# Patient Record
Sex: Female | Born: 1960
Health system: Southern US, Community
[De-identification: ages and names within clinical notes are randomized; demographics above are authoritative.]

## PROBLEM LIST (undated history)

## (undated) DIAGNOSIS — Z8719 Personal history of other diseases of the digestive system: Secondary | ICD-10-CM

## (undated) DIAGNOSIS — E669 Obesity, unspecified: Secondary | ICD-10-CM

## (undated) DIAGNOSIS — E119 Type 2 diabetes mellitus without complications: Secondary | ICD-10-CM

## (undated) DIAGNOSIS — J189 Pneumonia, unspecified organism: Secondary | ICD-10-CM

## (undated) DIAGNOSIS — F329 Major depressive disorder, single episode, unspecified: Secondary | ICD-10-CM

## (undated) DIAGNOSIS — M1990 Unspecified osteoarthritis, unspecified site: Secondary | ICD-10-CM

## (undated) DIAGNOSIS — K219 Gastro-esophageal reflux disease without esophagitis: Secondary | ICD-10-CM

## (undated) DIAGNOSIS — F32A Depression, unspecified: Secondary | ICD-10-CM

## (undated) DIAGNOSIS — J449 Chronic obstructive pulmonary disease, unspecified: Secondary | ICD-10-CM

## (undated) DIAGNOSIS — F419 Anxiety disorder, unspecified: Secondary | ICD-10-CM

## (undated) DIAGNOSIS — N390 Urinary tract infection, site not specified: Secondary | ICD-10-CM

## (undated) HISTORY — DX: Pneumonia, unspecified organism: J18.9

## (undated) HISTORY — DX: Anxiety disorder, unspecified: F41.9

## (undated) HISTORY — PX: TUBAL LIGATION: SHX77

## (undated) HISTORY — DX: Obesity, unspecified: E66.9

## (undated) HISTORY — DX: Urinary tract infection, site not specified: N39.0

## (undated) HISTORY — PX: CHOLECYSTECTOMY: SHX55

## (undated) HISTORY — DX: Depression, unspecified: F32.A

## (undated) HISTORY — PX: UPPER GI ENDOSCOPY: SHX6162

## (undated) HISTORY — PX: ABDOMINAL HYSTERECTOMY: SHX81

## (undated) HISTORY — PX: COLONOSCOPY: SHX174

## (undated) HISTORY — DX: Unspecified osteoarthritis, unspecified site: M19.90

---
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
## 2005-09-21 ENCOUNTER — Inpatient Hospital Stay (HOSPITAL_COMMUNITY): Admission: AD | Admit: 2005-09-21 | Discharge: 2005-09-21 | Payer: Self-pay | Admitting: *Deleted

## 2005-10-16 ENCOUNTER — Ambulatory Visit: Payer: Self-pay | Admitting: Obstetrics and Gynecology

## 2005-12-03 ENCOUNTER — Ambulatory Visit: Payer: Self-pay | Admitting: Obstetrics and Gynecology

## 2005-12-03 ENCOUNTER — Inpatient Hospital Stay (HOSPITAL_COMMUNITY): Admission: RE | Admit: 2005-12-03 | Discharge: 2005-12-05 | Payer: Self-pay | Admitting: Obstetrics and Gynecology

## 2005-12-03 ENCOUNTER — Encounter (INDEPENDENT_AMBULATORY_CARE_PROVIDER_SITE_OTHER): Payer: Self-pay | Admitting: *Deleted

## 2005-12-09 ENCOUNTER — Inpatient Hospital Stay (HOSPITAL_COMMUNITY): Admission: AD | Admit: 2005-12-09 | Discharge: 2005-12-09 | Payer: Self-pay | Admitting: Obstetrics and Gynecology

## 2006-01-01 ENCOUNTER — Ambulatory Visit: Payer: Self-pay | Admitting: Obstetrics and Gynecology

## 2008-02-18 ENCOUNTER — Ambulatory Visit: Payer: Self-pay | Admitting: Obstetrics and Gynecology

## 2011-03-27 NOTE — Group Therapy Note (Signed)
NAMEMarland Kitchen  [name], [name] NO.:  [id number]   MEDICAL RECORD NO.:  [id number]          PATIENT TYPE:  WOC   LOCATION:  [facility]                   FACILITY:  [facility]   PHYSICIAN:  Argentina Donovan, MD        DATE OF BIRTH:  1961-05-08   DATE OF SERVICE:  02/18/2008                                  CLINIC NOTE   The patient is a 50 year old African American female who we saw in 2007  and underwent a total abdominal hysterectomy.  She had been well up  until recently.  She was recently seen in the emergency room at [facility] complaining of right lower quadrant pain that did seem to change  to the left lower quadrant and shot down to the mid portion suprapubic  area.  Down there they did a CT of the abdomen and pelvis and found a  nonobstructing calculus on the left kidney.  Her kidneys other than that  were not remarkable.  Adrenal glands were normal.  The bowel was normal.  The aorta and superior vena cava were normal.  The patient's liver,  spleen, gallbladder, biliary tree and pancreas were all normal.  The  urinary bladder filled and was normal.  They thought there might be a  small follicular cyst on the right ovary but that was not quite clear.  The uterus obviously was absent.  There were no signs of any pelvic  abnormalities.  No inguinal or abdominal wall abnormalities.  It sounded  like the patient may be having bladder spasm.  Will get a urine culture  and analysis and also try her on some Pyridium and see if that is of any  help to her.   DIAGNOSIS:  Low pelvic pain of unknown etiology.           ______________________________  Argentina Donovan, MD     PR/MEDQ  D:  02/18/2008  T:  02/18/2008  Job:  [id number]

## 2011-03-30 NOTE — Op Note (Signed)
[name], [name]                ACCOUNT NO.:  [id number]   MEDICAL RECORD NO.:  [id number]          PATIENT TYPE:  INP   LOCATION:  9399                          [facility]   PHYSICIAN:  Phil D. Okey Dupre, M.D.     DATE OF BIRTH:  28-Feb-1961   DATE OF PROCEDURE:  12/03/2005  DATE OF DISCHARGE:                                 OPERATIVE REPORT   PROCEDURE:  Total abdominal hysterectomy.   PREOPERATIVE DIAGNOSIS:  Symptomatic fibroids with menometrorrhagia and  chronic pelvic pain and dysmenorrhea.   SURGEON:  Javier Glazier. Okey Dupre, M.D.   FIRST ASSISTANT:  Shelbie Proctor. Shawnie Pons, M.D.   ANESTHESIA:  General.   SPECIMENS TO PATHOLOGY:  Uterus.   ESTIMATED BLOOD LOSS:  100 mL.   POSTOPERATIVE CONDITION:  Satisfactory.   The procedure went as follows:   OPERATIVE FINDINGS:  On entering the peritoneal cavity, attention was  directed to the pelvis, where there was a uterus markedly deformed with  multiple intramural and subserous leiomyomata.  In total the uterus looked  like a 12-week size pregnancy.  The right ovary was completely normal, as  was the tube, with a small Falope ring on each tube.  The left ovary showed  a small follicular cyst.  The upper abdominal viscera was explored and found  to be within normal limits/   The procedure went as follows:  Under satisfactory general anesthesia with  the patient in the dorsal supine position with a Foley catheter in the  urinary bladder, the vagina had been prepped.  The abdomen was prepped and  draped in the usual sterile manner and entered through a Pfannenstiel  incision.  This incision was approximately 2 cm above the symphysis pubis  for a total length of 16 cm  On entering the peritoneal cavity, the lower  abdomen was opened in a typical Pfannenstiel method and on entering the  peritoneal cavity, the findings were as above.  A Balfour retractor was  placed in the abdomen and the uterus brought out through the incision.  Straight Kocher  clamps wee placed lateral to the uterus, immediately lateral  to the uterus to include the fallopian tubes down to the round ligament.  This also was used for traction.  The round ligaments were then ligated with  #1 chromic catgut suture ligature, divided, and the anterior leaf of broad  ligament opened parallel to the uterus and extending around to the anterior  superior portion of the cervix, opening made in the avascular portions of  the broad ligament, through which #1 chromic catgut suture was delivered,  and this was tied medial to the ovary on the utero-ovarian ligament and  fallopian tube.  A clamp was placed medial to the aforementioned, the tissue  medial to that divided on both sides and the lateral pedicle ligated with #1  chromic catgut suture ligature.  The bladder was pushed away from the  anterior surface of the cervix by blunt dissection until it was down to the  base of the cervix.  The uterine vessels were then skeletonized, doubly  clamped, divided,  doubly ligated #1 chromic catgut suture ligature.  Cardinal ligaments were clamped, divided and ligated with #1 chromic catgut  suture ligature.  At this point because of the size of the uterus, the  uterus was dissected away from the apex of the cervix by sharp dissection,  which left only the cervix in place.  The vagina was entered to the anterior  lower portion of the cervix and the cervix dissected with a Jorgenson  scissors away from the apex of the vagina.  Chromic #1 catgut sutures were  placed into the lateral vaginal cuff angles and the rest of the cuff run  from anterior to posterior with continuous running 0 Vicryl suture on an  atraumatic needle.  Care was used to make sure that coming through the  tissue that we were able to grasp the vaginal tissue,  There seemed to  oozing along the suture line, so second figure-of-eight sutures two in  number of the same material, were placed on the vaginal cuff for  hemostasis.  Te area was observed and no bleeding noted either the areas of the ovaries  or up to and including the vaginal cuff  The pelvis was irrigated, the upper  abdominal viscera explored as aforementioned, and the fascia closed with a  continuous running locked 0 Vicryl suture going from each end of the  incision and meeting in the midpoint.  The subcutaneous bleeders were  controlled with hot cautery and skin edges approximated with skin staples.  Dry sterile dressing was applied.  The patient had a total blood loss 700  mL.  Tape, instrument, sponge and needle count were reported correct at the  end of the procedure.  The patient was transferred to the recovery room in  satisfactory condition, having tolerated the procedure well.           ______________________________  Javier Glazier. Okey Dupre, M.D.     PDR/MEDQ  D:  12/03/2005  T:  12/03/2005  Job:  [id number]

## 2011-03-30 NOTE — Discharge Summary (Signed)
[name], [name]                ACCOUNT NO.:  [id number]   MEDICAL RECORD NO.:  [id number]          PATIENT TYPE:  INP   LOCATION:  9317                          [facility]   PHYSICIAN:  Phil D. Okey Dupre, M.D.     DATE OF BIRTH:  06-May-1961   DATE OF ADMISSION:  12/03/2005  DATE OF DISCHARGE:                                 DISCHARGE SUMMARY   The patient is a 50 year old black female who on the day of admission  underwent total abdominal hysterectomy for symptomatic leiomyomata. Her  postoperative course to date has been benign. The patient is voiding well  and had a bowel movement the a.m. of discharge. The pathology is not  available at this point. Her hemoglobin is stable at 11.2. The wound is  clean, the lungs were clear, the abdomen is soft and flat with active bowel  sounds, extremities negative. The patient is anxious for discharge. We have  given her discharge instructions as to diet, activity and follow-up. She  will come into the MAU next Sunday for staple removal and has an appointment  in the GYN clinic on December 18, 2005.   DISCHARGE DIAGNOSES:  1.  Status post abdominal hysterectomy, excellent progress.  2.  Symptomatic leiomyomata uteri.           ______________________________  Javier Glazier. Okey Dupre, M.D.     PDR/MEDQ  D:  12/05/2005  T:  12/05/2005  Job:  [id number]

## 2011-03-30 NOTE — H&P (Signed)
NAME:  [name], [name]                    ACCOUNT NO.:  0   MEDICAL RECORD NO.:  [id number]           PATIENT TYPE:   LOCATION:                                 FACILITY:   PHYSICIAN:  Phil D. Okey Dupre, M.D.     DATE OF BIRTH:  1961/09/12   DATE OF ADMISSION:  12/03/2005  DATE OF DISCHARGE:                                HISTORY & PHYSICAL   HISTORY OF PRESENT ILLNESS:  The patient is a 50 year old, African-American  female, G4, P3-0-1-3 who had a history of a tubal ligation.  She has had a  long history of severe pelvic pain during her period and increasing  menorrhagia with passage of clots and secondary anemia.  She also complains  that radiates down causing leg spasm in her lower back.  I told her that  taking the uterus out probably is not going to relieve those problems, but  she seemed to desire to go through this because the periods are so  uncomfortable for her.  Recent urinary tract infection was treated.   PAST MEDICAL HISTORY:  1.  Normal vaginal deliveries.  2.  Tubal ligation.  3.  Recent treatment for cystitis.   ALLERGIES:  PENICILLIN.   MEDICATIONS:  Multivitamins.   FAMILY HISTORY:  Diabetes, high blood pressure.  All her brothers and  sisters are alive and well as is her mother and father.   SOCIAL HISTORY:  She smokes three cigarettes a day.  She smokes marijuana,  but no takes no other illegal drugs.  She does not drink alcohol.   PHYSICAL EXAMINATION:  VITAL SIGNS:  Weight 167, height 5 feet 1 inch.  Blood pressure 117/70, pulse 68 per minute, temperature 98.7, respirations  18 per minute.  GENERAL:  Well-developed, slightly obese, African-American female in no  acute distress.  HEENT:  Within normal limits.  NECK:  Supple with no rigidity, no masses, thyroid symmetrical.  HEART:  No murmur with normal sinus rhythm.  LUNGS:  Clear to auscultation and percussion.  ABDOMEN:  Soft, flat, nontender, no masses or organomegaly.  EXTREMITIES:  No edema, no  varices.  NEUROLOGIC:  Deep tendon reflexes within normal limits.  SKIN:  Normal turgor.  No significant lesions.  GENITALIA:  External genitalia normal.  BUS within normal limits.  Vagina  clean and well-rugated.  Uterus is markedly irregular at about 12-week  gestational size.  The adnexa could not be well-palpated because of the  habitus of the patient.  The cul-de-sac was free.  The bladder was quite  tender to palpation.  RECTAL:  No masses identified.  Hemoccult negative.   IMPRESSION:  Symptomatic leiomyomata uteri with menorrhagia and increasing  dysmenorrhea.   PLAN:  Total abdominal hysterectomy.  We have talked to the patient about  her ovaries and that unless they are significantly diseased, we plan on  leaving her ovaries.  We discussed with her and answered her questions on  sexual activity and activity after the surgery.  We have discussed a plan  immediately post surgical concerning pain medication, pain control, diet  as  well as Foley catheter.  We have discussed the major complications  especially those related to infection, hemorrhage, urinary and  gastrointestinal tract injury and anesthetic complications.  We have give  her the precautions postoperatively to be needed.           ______________________________  Javier Glazier Okey Dupre, M.D.     PDR/MEDQ  D:  11/29/2005  T:  11/29/2005  Job:  [id number]

## 2011-03-30 NOTE — Group Therapy Note (Signed)
NAME:  [name], [name] NO.:  [id number]   MEDICAL RECORD NO.:  [id number]          PATIENT TYPE:  WOC   LOCATION:  [facility]                   FACILITY:  [facility]   PHYSICIAN:  Argentina Donovan, MD        DATE OF BIRTH:  1961-06-27   DATE OF SERVICE:                                    CLINIC NOTE   The patient is a 50 year old gravida 4, para 3-0-1-3 (youngest child 57  years old), who, over the past year, has had severe pelvic pain with  increasing menorrhagia with passage of clots.  She was recently seen in the  maternity admissions with suprapubic pain and a severe urinary tract  infection, i.e., cystitis, that was treated with Macrobid.  She still feels  that she has infection.  We are going to get a urine culture and  sensitivity, as well as a urinalysis today.  Patient has had a Pap smear  normal within the past year.   PHYSICAL EXAMINATION:  PELVIC:  External genitalia was normal.  BUS within  normal limits.  The vagina is clean and well rugated.  The cervix is clean  and parous.  The uterus is about 11-12 weeks' size and markedly irregular in  configuration.  Cul de sac is free.  Bladder is quite tender to palpation.   She also complains of sharp lower abdominal pains that radiate around leg  spasms to her lower back.  I have told her that taking out the uterus  probably may not relieve those problems.  She seems to understand this and  desires to go through with a total abdominal hysterectomy.  We are going to  go ahead and get her scheduled.  Meanwhile, we will treat the urinary tract  infection when the results come back if necessary.  The patient has had  previous surgery.  It was a tubal ligation.  She smokes marijuana, but takes  no other illicit drugs.  She smokes three cigarettes a day, down from being  a heavy smoker.   FAMILY HISTORY:  Noncontributory.   PAST MEDICAL HISTORY:  She has had a relatively negative past medical  history.  She is in school  now to become a Engineer, site, but wants the  surgery, so she is willing to take off whatever time is necessary.   IMPRESSION:  Symptomatic leiomyomata for a total abdominal hysterectomy.           ______________________________  Argentina Donovan, MD     PR/MEDQ  D:  10/16/2005  T:  10/16/2005  Job:  [id number]

## 2011-08-07 LAB — POCT URINALYSIS DIP (DEVICE)
Bilirubin Urine: NEGATIVE
Glucose, UA: NEGATIVE
Hgb urine dipstick: NEGATIVE
Ketones, ur: NEGATIVE
Nitrite: NEGATIVE
Operator id: 194561
Protein, ur: NEGATIVE
Specific Gravity, Urine: 1.01
Urobilinogen, UA: 0.2
pH: 6

## 2013-07-12 ENCOUNTER — Emergency Department (HOSPITAL_BASED_OUTPATIENT_CLINIC_OR_DEPARTMENT_OTHER): Payer: Self-pay

## 2013-07-12 ENCOUNTER — Encounter (HOSPITAL_BASED_OUTPATIENT_CLINIC_OR_DEPARTMENT_OTHER): Payer: Self-pay | Admitting: *Deleted

## 2013-07-12 ENCOUNTER — Emergency Department (HOSPITAL_BASED_OUTPATIENT_CLINIC_OR_DEPARTMENT_OTHER)
Admission: EM | Admit: 2013-07-12 | Discharge: 2013-07-12 | Disposition: A | Discharge status: Home / Self Care | Payer: Self-pay | Attending: Emergency Medicine | Admitting: Emergency Medicine

## 2013-07-12 DIAGNOSIS — Z7982 Long term (current) use of aspirin: Secondary | ICD-10-CM | POA: Insufficient documentation

## 2013-07-12 DIAGNOSIS — H9209 Otalgia, unspecified ear: Secondary | ICD-10-CM | POA: Insufficient documentation

## 2013-07-12 DIAGNOSIS — F17200 Nicotine dependence, unspecified, uncomplicated: Secondary | ICD-10-CM | POA: Insufficient documentation

## 2013-07-12 DIAGNOSIS — R0982 Postnasal drip: Secondary | ICD-10-CM | POA: Insufficient documentation

## 2013-07-12 DIAGNOSIS — E119 Type 2 diabetes mellitus without complications: Secondary | ICD-10-CM | POA: Insufficient documentation

## 2013-07-12 DIAGNOSIS — R062 Wheezing: Secondary | ICD-10-CM | POA: Insufficient documentation

## 2013-07-12 DIAGNOSIS — J40 Bronchitis, not specified as acute or chronic: Secondary | ICD-10-CM

## 2013-07-12 DIAGNOSIS — Z880 Allergy status to penicillin: Secondary | ICD-10-CM | POA: Insufficient documentation

## 2013-07-12 DIAGNOSIS — Z79899 Other long term (current) drug therapy: Secondary | ICD-10-CM | POA: Insufficient documentation

## 2013-07-12 DIAGNOSIS — J3489 Other specified disorders of nose and nasal sinuses: Secondary | ICD-10-CM | POA: Insufficient documentation

## 2013-07-12 DIAGNOSIS — J209 Acute bronchitis, unspecified: Secondary | ICD-10-CM | POA: Insufficient documentation

## 2013-07-12 HISTORY — DX: Type 2 diabetes mellitus without complications: E11.9

## 2013-07-12 LAB — CBC WITH DIFFERENTIAL/PLATELET
Basophils Relative: 0 % (ref 0–1)
Eosinophils Absolute: 0.2 10*3/uL (ref 0.0–0.7)
HCT: 35 % — ABNORMAL LOW (ref 36.0–46.0)
Hemoglobin: 11.9 g/dL — ABNORMAL LOW (ref 12.0–15.0)
Lymphocytes Relative: 42 % (ref 12–46)
Lymphs Abs: 3 10*3/uL (ref 0.7–4.0)
MCH: 25.7 pg — ABNORMAL LOW (ref 26.0–34.0)
MCHC: 34 g/dL (ref 30.0–36.0)
MCV: 75.6 fL — ABNORMAL LOW (ref 78.0–100.0)
Neutro Abs: 3.5 10*3/uL (ref 1.7–7.7)
Neutrophils Relative %: 49 % (ref 43–77)
Platelets: 129 10*3/uL — ABNORMAL LOW (ref 150–400)
RBC: 4.63 MIL/uL (ref 3.87–5.11)
RDW: 14.7 % (ref 11.5–15.5)
WBC: 7.2 10*3/uL (ref 4.0–10.5)

## 2013-07-12 LAB — BASIC METABOLIC PANEL
BUN: 12 mg/dL (ref 6–23)
CO2: 24 mEq/L (ref 19–32)
Calcium: 9.3 mg/dL (ref 8.4–10.5)
Chloride: 105 mEq/L (ref 96–112)
Creatinine, Ser: 0.7 mg/dL (ref 0.50–1.10)
GFR calc Af Amer: >90 mL/min (ref >90)
GFR calc non Af Amer: >90 mL/min (ref >90)
Glucose, Bld: 127 mg/dL — ABNORMAL HIGH (ref 70–99)
Potassium: 3.2 mEq/L — ABNORMAL LOW (ref 3.5–5.1)
Sodium: 139 mEq/L (ref 135–145)

## 2013-07-12 LAB — TROPONIN I: Troponin I: <0.3 ng/mL (ref <0.30)

## 2013-07-12 MED ORDER — ALBUTEROL SULFATE (5 MG/ML) 0.5% IN NEBU
5.0000 mg | INHALATION_SOLUTION | Freq: Once | RESPIRATORY_TRACT | Status: AC
  Administered 2013-07-12: 5 mg via RESPIRATORY_TRACT

## 2013-07-12 MED ORDER — LORATADINE 10 MG PO TABS: 10.0000 mg | ORAL_TABLET | Freq: Every day | ORAL | Status: DC

## 2013-07-12 MED ORDER — ALBUTEROL SULFATE HFA 108 (90 BASE) MCG/ACT IN AERS: 1.0000 | INHALATION_SPRAY | Freq: Four times a day (QID) | RESPIRATORY_TRACT | Status: DC | PRN

## 2013-07-12 MED ORDER — AZITHROMYCIN 250 MG PO TABS: ORAL_TABLET | ORAL | Status: DC

## 2013-07-12 MED ORDER — FLUTICASONE PROPIONATE 50 MCG/ACT NA SUSP: 2.0000 | Freq: Every day | NASAL | Status: DC

## 2013-07-12 MED ORDER — ALBUTEROL SULFATE (5 MG/ML) 0.5% IN NEBU
INHALATION_SOLUTION | RESPIRATORY_TRACT | Status: AC
  Administered 2013-07-12: 5 mg via RESPIRATORY_TRACT
  Filled 2013-07-12: qty 1

## 2013-07-12 MED ORDER — POTASSIUM CHLORIDE CRYS ER 20 MEQ PO TBCR
40.0000 meq | EXTENDED_RELEASE_TABLET | Freq: Once | ORAL | Status: AC
  Administered 2013-07-12: 40 meq via ORAL
  Filled 2013-07-12: qty 2

## 2013-07-12 MED ORDER — IPRATROPIUM BROMIDE 0.02 % IN SOLN
RESPIRATORY_TRACT | Status: AC
  Administered 2013-07-12: 0.5 mg
  Filled 2013-07-12: qty 2.5

## 2013-07-12 MED ORDER — PREDNISONE 20 MG PO TABS: ORAL_TABLET | ORAL | Status: DC

## 2013-07-12 MED ORDER — PREDNISONE 10 MG PO TABS
60.0000 mg | ORAL_TABLET | Freq: Once | ORAL | Status: AC
  Administered 2013-07-12: 60 mg via ORAL
  Filled 2013-07-12: qty 1

## 2013-07-12 NOTE — ED Provider Notes (Signed)
CSN: [id number]     Arrival date & time 07/12/13  0023 History  This chart was scribed for Savoy Somerville Smitty Cords, MD by Karle Plumber, ED Scribe. This patient was seen in room MH05/MH05 and the patient's care was started at 12:40 AM.    Chief Complaint  Patient presents with  . Cough   Patient is a 52 y.o. female presenting with cough. The history is provided by the patient. No language interpreter was used.  Cough Cough characteristics:  Productive Sputum characteristics:  Green Severity:  Moderate Onset quality:  Gradual Duration:  3 days Timing:  Constant Progression:  Unchanged Chronicity:  New Smoker: yes   Context: smoke exposure   Relieved by:  Nothing Worsened by:  Nothing tried Ineffective treatments:  None tried Associated symptoms: ear pain and wheezing   Associated symptoms: no chest pain, no chills, no diaphoresis, no fever, no headaches, no rash and no weight loss    HPI Comments:  REECE MCBROOM is a 52 y.o. female with history of DM without complication who presents to the Emergency Department complaining of worsening productive cough for the past three days. She states that she produces a greenish sputum. She is also complaining of constant wheezing onset 3 days ago. There is associated congestion and right ear pain. Patient denies chest pain, fever, chills, sore throat, back pain, visual disturbance, abdominal pain, nausea, vomiting, headaches, rash, confusion or history of bleeding easily. She is a current smoker.   Past Medical History  Diagnosis Date  . Diabetes mellitus without complication    Past Surgical History  Procedure Laterality Date  . Abdominal hysterectomy     No family history on file. History  Substance Use Topics  . Smoking status: Current Some Day Smoker  . Smokeless tobacco: Not on file  . Alcohol Use: Yes     Comment: occasional    OB History   Grav Para Term Preterm Abortions TAB SAB Ect Mult Living                 Review of  Systems  Constitutional: Negative for fever, chills, weight loss and diaphoresis.  HENT: Positive for ear pain and congestion.   Eyes: Negative for visual disturbance.  Respiratory: Positive for cough and wheezing.   Cardiovascular: Negative for chest pain.  Gastrointestinal: Negative for vomiting and abdominal pain.  Genitourinary: Negative for difficulty urinating.  Musculoskeletal: Negative for back pain.  Skin: Negative for rash.  Neurological: Negative for headaches.  Hematological: Does not bruise/bleed easily.  Psychiatric/Behavioral: Negative for confusion.  All other systems reviewed and are negative.    Allergies  Penicillins and Sulfa antibiotics  Home Medications   Current Outpatient Rx  Name  Route  Sig  Dispense  Refill  . aspirin 81 MG tablet   Oral   Take 81 mg by mouth daily.         . metFORMIN (GLUCOPHAGE) 500 MG tablet   Oral   Take 500 mg by mouth daily. 250mg  AM and 250mg  PM          Triage Vitals: BP 124/85  Temp(Src) 97.8 F (36.6 C)  Resp 24  Ht 5\' 2"  (1.575 m)  Wt 202 lb (91.627 kg)  BMI 36.94 kg/m2  SpO2 100% Physical Exam  Vitals reviewed. Constitutional: She is oriented to person, place, and time. She appears well-developed and well-nourished.  HENT:  Head: Normocephalic and atraumatic.  Right Ear: Tympanic membrane, external ear and ear canal normal. No mastoid tenderness.  Tympanic membrane is not injected. No hemotympanum.  Left Ear: Tympanic membrane, external ear and ear canal normal. No mastoid tenderness. Tympanic membrane is not injected. No hemotympanum.  Clear, colorless drainage down the back of the throat with cobblestoning.  No abnormality behind right ear. No evidence of infection in right ear.  Eyes: Conjunctivae are normal. Pupils are equal, round, and reactive to light.  Neck: Normal range of motion. Neck supple.  Cardiovascular: Normal rate, regular rhythm, normal heart sounds and intact distal pulses.   No murmur  heard. Pulmonary/Chest: Effort normal. She has wheezes. She has no rales.  Abdominal: Soft. Bowel sounds are normal. There is no tenderness. There is no rebound and no guarding.  Musculoskeletal: Normal range of motion.  Lymphadenopathy:       Head (right side): No preauricular and no posterior auricular adenopathy present.       Head (left side): No preauricular and no posterior auricular adenopathy present.    She has no cervical adenopathy.  Neurological: She is alert and oriented to person, place, and time. She has normal reflexes.  Skin: Skin is warm and dry.  Psychiatric: She has a normal mood and affect.    ED Course  Procedures (including critical care time) DIAGNOSTIC STUDIES: Oxygen Saturation is 100% on RA, normal by my interpretation.   COORDINATION OF CARE: 12:45 AM- Pt verbalizes understanding and agrees to plan.   Labs Review Labs Reviewed  CBC WITH DIFFERENTIAL - Abnormal; Notable for the following:    Hemoglobin 11.9 (*)    HCT 35.0 (*)    MCV 75.6 (*)    MCH 25.7 (*)    Platelets 129 (*)    All other components within normal limits  TROPONIN I  BASIC METABOLIC PANEL    Imaging Review Dg Chest 2 View  07/12/2013   *RADIOLOGY REPORT*  Clinical Data: Cough and congestion.  CHEST - 2 VIEW  Comparison: None currently available.  Findings: No acute osseous findings.  Normal heart size and mediastinal contours.  Central airway thickening. Faint, indistinct left suprahilar opacity, likely vascular or summation of shadows.  Eventration of the diaphragm.  IMPRESSION:  1.  Bronchitic changes suggesting airway inflammation or infection. 2.  Vague left suprahilar density which could be projectional, inflammatory, or a nodule. Recommend repeat study after treatment.   Original Report Authenticated By: Tiburcio Pea    MDM  No diagnosis found. Will treat for bronchitis, as smoker and studies indicate better outcomes with antibiotics will add zpak.  Symptoms > 8 hours  with negative ekg and troponin ACS is excluded.  Patient informed of lesion seen on chest xray and need for close follow up with family doctor for repeat chest xray.  Information on discharge papers as well.  Patient verbalizes understanding and agrees to follow up  Date: 07/12/2013  Rate: 80  Rhythm: normal sinus rhythm  QRS Axis: normal  Intervals: normal  ST/T Wave abnormalities: nonspecific ST changes  Conduction Disutrbances:none  Narrative Interpretation:   Old EKG Reviewed: none available  I personally performed the services described in this documentation, which was scribed in my presence. The recorded information has been reviewed and is accurate.       Jasmine Awe, MD 07/12/13 [phone]

## 2013-07-12 NOTE — ED Notes (Signed)
MD at bedside. 

## 2013-07-12 NOTE — ED Notes (Signed)
C/o cough for past 3 days that is worse at night. States she is a smoker. States fevers on and off. States productive cough light in color. States chest "burning" when she is coughing. States she feels a little sob at night when trying to sleep.

## 2013-08-16 ENCOUNTER — Other Ambulatory Visit (HOSPITAL_BASED_OUTPATIENT_CLINIC_OR_DEPARTMENT_OTHER): Payer: Self-pay | Admitting: Internal Medicine

## 2013-08-16 ENCOUNTER — Ambulatory Visit (HOSPITAL_BASED_OUTPATIENT_CLINIC_OR_DEPARTMENT_OTHER)
Admission: RE | Admit: 2013-08-16 | Discharge: 2013-08-16 | Disposition: A | Discharge status: Home / Self Care | Payer: Self-pay | Source: Ambulatory Visit | Attending: Internal Medicine | Admitting: Internal Medicine

## 2013-08-16 DIAGNOSIS — Z09 Encounter for follow-up examination after completed treatment for conditions other than malignant neoplasm: Secondary | ICD-10-CM

## 2013-08-16 DIAGNOSIS — R079 Chest pain, unspecified: Secondary | ICD-10-CM | POA: Insufficient documentation

## 2014-07-21 ENCOUNTER — Emergency Department (HOSPITAL_BASED_OUTPATIENT_CLINIC_OR_DEPARTMENT_OTHER): Payer: Self-pay

## 2014-07-21 ENCOUNTER — Encounter (HOSPITAL_BASED_OUTPATIENT_CLINIC_OR_DEPARTMENT_OTHER): Payer: Self-pay | Admitting: Emergency Medicine

## 2014-07-21 ENCOUNTER — Emergency Department (HOSPITAL_BASED_OUTPATIENT_CLINIC_OR_DEPARTMENT_OTHER)
Admission: EM | Admit: 2014-07-21 | Discharge: 2014-07-22 | Disposition: A | Discharge status: Home / Self Care | Payer: Self-pay | Attending: Emergency Medicine | Admitting: Emergency Medicine

## 2014-07-21 DIAGNOSIS — Z792 Long term (current) use of antibiotics: Secondary | ICD-10-CM | POA: Insufficient documentation

## 2014-07-21 DIAGNOSIS — E119 Type 2 diabetes mellitus without complications: Secondary | ICD-10-CM | POA: Insufficient documentation

## 2014-07-21 DIAGNOSIS — Z90710 Acquired absence of both cervix and uterus: Secondary | ICD-10-CM | POA: Insufficient documentation

## 2014-07-21 DIAGNOSIS — Z79899 Other long term (current) drug therapy: Secondary | ICD-10-CM | POA: Insufficient documentation

## 2014-07-21 DIAGNOSIS — F17200 Nicotine dependence, unspecified, uncomplicated: Secondary | ICD-10-CM | POA: Insufficient documentation

## 2014-07-21 DIAGNOSIS — Z7982 Long term (current) use of aspirin: Secondary | ICD-10-CM | POA: Insufficient documentation

## 2014-07-21 DIAGNOSIS — R1013 Epigastric pain: Secondary | ICD-10-CM | POA: Insufficient documentation

## 2014-07-21 DIAGNOSIS — K5900 Constipation, unspecified: Secondary | ICD-10-CM | POA: Insufficient documentation

## 2014-07-21 DIAGNOSIS — Z880 Allergy status to penicillin: Secondary | ICD-10-CM | POA: Insufficient documentation

## 2014-07-21 DIAGNOSIS — IMO0002 Reserved for concepts with insufficient information to code with codable children: Secondary | ICD-10-CM | POA: Insufficient documentation

## 2014-07-21 LAB — LIPASE, BLOOD: Lipase: 23 U/L (ref 11–59)

## 2014-07-21 LAB — URINALYSIS, ROUTINE W REFLEX MICROSCOPIC
Bilirubin Urine: NEGATIVE
GLUCOSE, UA: NEGATIVE mg/dL
Hgb urine dipstick: NEGATIVE
KETONES UR: NEGATIVE mg/dL
Nitrite: NEGATIVE
Protein, ur: NEGATIVE mg/dL
Specific Gravity, Urine: 1.016 (ref 1.005–1.030)
Urobilinogen, UA: 0.2 mg/dL (ref 0.0–1.0)
pH: 5.5 (ref 5.0–8.0)

## 2014-07-21 LAB — BASIC METABOLIC PANEL
ANION GAP: 11 (ref 5–15)
BUN: 10 mg/dL (ref 6–23)
CO2: 26 meq/L (ref 19–32)
Calcium: 9.6 mg/dL (ref 8.4–10.5)
Chloride: 108 mEq/L (ref 96–112)
Creatinine, Ser: 0.8 mg/dL (ref 0.50–1.10)
GFR calc non Af Amer: 83 mL/min — ABNORMAL LOW (ref >90)
Glucose, Bld: 99 mg/dL (ref 70–99)
Potassium: 3.9 mEq/L (ref 3.7–5.3)
Sodium: 145 mEq/L (ref 137–147)

## 2014-07-21 LAB — URINE MICROSCOPIC-ADD ON

## 2014-07-21 LAB — HEPATIC FUNCTION PANEL
ALBUMIN: 3.5 g/dL (ref 3.5–5.2)
ALT: 12 U/L (ref 0–35)
AST: 16 U/L (ref 0–37)
Alkaline Phosphatase: 104 U/L (ref 39–117)
Bilirubin, Direct: <0.2 mg/dL (ref 0.0–0.3)
Total Bilirubin: <0.2 mg/dL — ABNORMAL LOW (ref 0.3–1.2)
Total Protein: 6.9 g/dL (ref 6.0–8.3)

## 2014-07-21 LAB — CBC WITH DIFFERENTIAL/PLATELET
BASOS PCT: 1 % (ref 0–1)
Basophils Absolute: 0.1 10*3/uL (ref 0.0–0.1)
Eosinophils Absolute: 0.3 10*3/uL (ref 0.0–0.7)
Eosinophils Relative: 3 % (ref 0–5)
HEMATOCRIT: 36.9 % (ref 36.0–46.0)
Hemoglobin: 12.8 g/dL (ref 12.0–15.0)
LYMPHS ABS: 3.8 10*3/uL (ref 0.7–4.0)
LYMPHS PCT: 45 % (ref 12–46)
MCH: 26.1 pg (ref 26.0–34.0)
MCHC: 34.7 g/dL (ref 30.0–36.0)
MCV: 75.3 fL — ABNORMAL LOW (ref 78.0–100.0)
MONOS PCT: 5 % (ref 3–12)
Monocytes Absolute: 0.4 10*3/uL (ref 0.1–1.0)
NEUTROS ABS: 4 10*3/uL (ref 1.7–7.7)
Neutrophils Relative %: 46 % (ref 43–77)
Platelets: 162 10*3/uL (ref 150–400)
RBC: 4.9 MIL/uL (ref 3.87–5.11)
RDW: 15.6 % — ABNORMAL HIGH (ref 11.5–15.5)
WBC: 8.6 10*3/uL (ref 4.0–10.5)

## 2014-07-21 MED ORDER — SUCRALFATE 1 G PO TABS
1.0000 g | ORAL_TABLET | Freq: Once | ORAL | Status: AC
  Administered 2014-07-21: 1 g via ORAL
  Filled 2014-07-21: qty 1

## 2014-07-21 NOTE — ED Notes (Signed)
Pt requested food and something to drink, informed patient that she could not eat or drink until seen by a provider. Explained delay of care and patient with no other questions at this time

## 2014-07-21 NOTE — ED Notes (Signed)
C/o abd pain with bloating/back pain x 3 days, nausea

## 2014-07-21 NOTE — ED Provider Notes (Signed)
CSN: [id number]     Arrival date & time 07/21/14  2113 History  This chart was scribed for Wynetta Fines, MD by Jeanell Sparrow, ED Scribe. This patient was seen in room MH02/MH02 and the patient's care was started at 11:06 PM.   Chief Complaint  Patient presents with  . Abdominal Pain   The history is provided by the patient. No language interpreter was used.   HPI Comments: [name] is a 53 y.o. female who presents to the Emergency Department complaining of moderate intermittent epigastric abdominal that started about 3 days ago. Pt states that the pain is characterized by a sharp sensation and a severity of 8/10 currently. She states that the pain is worse at night. She also reports that she "has a fever in her stomach". She reports pain in her lower ribs around to her back. She states that she has been having some mild SOB, nausea, diaphoresis and abdominal swelling. She denies any vomiting.   Past Medical History  Diagnosis Date  . Diabetes mellitus without complication    Past Surgical History  Procedure Laterality Date  . Abdominal hysterectomy     No family history on file. History  Substance Use Topics  . Smoking status: Current Some Day Smoker    Types: Cigarettes  . Smokeless tobacco: Not on file  . Alcohol Use: Yes     Comment: occasional    OB History   Grav Para Term Preterm Abortions TAB SAB Ect Mult Living                 Review of Systems A complete 10 system review of systems was obtained and all systems are negative except as noted in the HPI and PMH.   Allergies  Penicillins and Sulfa antibiotics  Home Medications   Prior to Admission medications   Medication Sig Start Date End Date Taking? Authorizing Provider  albuterol (PROVENTIL HFA;VENTOLIN HFA) 108 (90 BASE) MCG/ACT inhaler Inhale 1-2 puffs into the lungs every 6 (six) hours as needed for wheezing. 07/12/13   April K Palumbo-Rasch, MD  aspirin 81 MG tablet Take 81 mg by mouth daily.     Historical Provider, MD  azithromycin (ZITHROMAX Z-PAK) 250 MG tablet 2 po day one, then 1 daily x 4 days 07/12/13   April K Palumbo-Rasch, MD  fluticasone [facility]) 50 MCG/ACT nasal spray Place 2 sprays into the nose daily. 07/12/13   April K Palumbo-Rasch, MD  loratadine (CLARITIN) 10 MG tablet Take 1 tablet (10 mg total) by mouth daily. 07/12/13   April K Palumbo-Rasch, MD  metFORMIN (GLUCOPHAGE) 500 MG tablet Take 500 mg by mouth daily. 250mg  AM and 250mg  PM    Historical Provider, MD  predniSONE (DELTASONE) 20 MG tablet 2 tabs po daily x 4 days 07/12/13   April K Palumbo-Rasch, MD   BP 129/83  Pulse 80  Temp(Src) 98 F (36.7 C) (Oral)  Resp 18  Ht 5\' 2"  (1.575 m)  Wt 172 lb (78.019 kg)  BMI 31.45 kg/m2  SpO2 100% Physical Exam  Nursing note and vitals reviewed. General: Well-developed, well-nourished female in no acute distress; appearance consistent with age of record HENT: normocephalic; atraumatic Eyes: pupils equal, round and reactive to light; extraocular muscles intact Neck: supple Heart: regular rate and rhythm; no murmurs, rubs or gallops Lungs: clear to auscultation bilaterally Chest: Bilateral anterior lower rib tenderness extending to back  Abdomen: soft; nondistended; epigastric tenderness; no masses or hepatosplenomegaly; bowel sounds present Extremities: No deformity;  full range of motion; pulses normal Neurologic: Awake, alert and oriented; motor function intact in all extremities and symmetric; no facial droop Skin: Warm and dry Psychiatric: Normal mood and affect   ED Course  Procedures (including critical care time) DIAGNOSTIC STUDIES: Oxygen Saturation is 100% on RA, normal by my interpretation.    COORDINATION OF CARE: 11:10 PM- Pt advised of plan for treatment which includes medication, radiology, and labs and pt agrees.  MDM   Nursing notes and vitals signs, including pulse oximetry, reviewed.  Summary of this visit's results, reviewed by  myself:  Labs:  Results for orders placed during the hospital encounter of 07/21/14 (from the past 24 hour(s))  URINALYSIS, ROUTINE W REFLEX MICROSCOPIC     Status: Abnormal   Collection Time    07/21/14 10:02 PM      Result Value Ref Range   Color, Urine YELLOW  YELLOW   APPearance CLEAR  CLEAR   Specific Gravity, Urine 1.016  1.005 - 1.030   pH 5.5  5.0 - 8.0   Glucose, UA NEGATIVE  NEGATIVE mg/dL   Hgb urine dipstick NEGATIVE  NEGATIVE   Bilirubin Urine NEGATIVE  NEGATIVE   Ketones, ur NEGATIVE  NEGATIVE mg/dL   Protein, ur NEGATIVE  NEGATIVE mg/dL   Urobilinogen, UA 0.2  0.0 - 1.0 mg/dL   Nitrite NEGATIVE  NEGATIVE   Leukocytes, UA TRACE (*) NEGATIVE  URINE MICROSCOPIC-ADD ON     Status: None   Collection Time    07/21/14 10:02 PM      Result Value Ref Range   Squamous Epithelial / LPF RARE  RARE   WBC, UA 0-2  <3 WBC/hpf   Bacteria, UA RARE  RARE  CBC WITH DIFFERENTIAL     Status: Abnormal   Collection Time    07/21/14 10:28 PM      Result Value Ref Range   WBC 8.6  4.0 - 10.5 K/uL   RBC 4.90  3.87 - 5.11 MIL/uL   Hemoglobin 12.8  12.0 - 15.0 g/dL   HCT 36.9  36.0 - 46.0 %   MCV 75.3 (*) 78.0 - 100.0 fL   MCH 26.1  26.0 - 34.0 pg   MCHC 34.7  30.0 - 36.0 g/dL   RDW 15.6 (*) 11.5 - 15.5 %   Platelets 162  150 - 400 K/uL   Neutrophils Relative % 46  43 - 77 %   Neutro Abs 4.0  1.7 - 7.7 K/uL   Lymphocytes Relative 45  12 - 46 %   Lymphs Abs 3.8  0.7 - 4.0 K/uL   Monocytes Relative 5  3 - 12 %   Monocytes Absolute 0.4  0.1 - 1.0 K/uL   Eosinophils Relative 3  0 - 5 %   Eosinophils Absolute 0.3  0.0 - 0.7 K/uL   Basophils Relative 1  0 - 1 %   Basophils Absolute 0.1  0.0 - 0.1 K/uL  BASIC METABOLIC PANEL     Status: Abnormal   Collection Time    07/21/14 10:28 PM      Result Value Ref Range   Sodium 145  137 - 147 mEq/L   Potassium 3.9  3.7 - 5.3 mEq/L   Chloride 108  96 - 112 mEq/L   CO2 26  19 - 32 mEq/L   Glucose, Bld 99  70 - 99 mg/dL   BUN 10  6 - 23  mg/dL   Creatinine, Ser 0.80  0.50 - 1.10 mg/dL  Calcium 9.6  8.4 - 10.5 mg/dL   GFR calc non Af Amer 83 (*) >90 mL/min   GFR calc Af Amer >90  >90 mL/min   Anion gap 11  5 - 15  HEPATIC FUNCTION PANEL     Status: Abnormal   Collection Time    07/21/14 10:28 PM      Result Value Ref Range   Total Protein 6.9  6.0 - 8.3 g/dL   Albumin 3.5  3.5 - 5.2 g/dL   AST 16  0 - 37 U/L   ALT 12  0 - 35 U/L   Alkaline Phosphatase 104  39 - 117 U/L   Total Bilirubin <0.2 (*) 0.3 - 1.2 mg/dL   Bilirubin, Direct <0.2  0.0 - 0.3 mg/dL   Indirect Bilirubin NOT CALCULATED  0.3 - 0.9 mg/dL  LIPASE, BLOOD     Status: None   Collection Time    07/21/14 10:28 PM      Result Value Ref Range   Lipase 23  11 - 59 U/L    Imaging Studies: Dg Abd Acute W/chest  07/22/2014   CLINICAL DATA:  Abdominal bloating  EXAM: ACUTE ABDOMEN SERIES (ABDOMEN 2 VIEW & CHEST 1 VIEW)  COMPARISON:  08/16/2013  FINDINGS: Lungs are clear.  No pleural effusion or pneumothorax.  The heart is normal in size.  Nonobstructive bowel gas pattern.  No evidence of free air under the diaphragm on the upright view.  Moderate colonic stool burden.  Visualized osseous structures are within normal limits.  IMPRESSION: No evidence of acute cardiopulmonary disease.  No evidence of small bowel obstruction or free air.  Moderate colonic stool burden.   Electronically Signed   By: Julian Hy M.D.   On: 07/22/2014 00:05   12:27 AM Patient advised of reassuring lab studies and x-rays consistent with constipation. She states that she moves her bowels 2-3 times a day and did a: Plans a month ago. In spite of these, she was advised that her x-rays still show a large amount of retained stool in the colon.  I personally performed the services described in this documentation, which was scribed in my presence. The recorded information has been reviewed and is accurate.   Wynetta Fines, MD 07/22/14 [phone]

## 2014-07-22 MED ORDER — POLYETHYLENE GLYCOL 3350 17 G PO PACK: PACK | ORAL | Status: DC

## 2014-07-22 NOTE — Discharge Instructions (Signed)

## 2015-06-09 ENCOUNTER — Ambulatory Visit: Payer: Self-pay | Admitting: Family Medicine

## 2015-06-30 ENCOUNTER — Ambulatory Visit: Payer: Self-pay | Admitting: Family Medicine

## 2018-10-31 ENCOUNTER — Emergency Department (HOSPITAL_BASED_OUTPATIENT_CLINIC_OR_DEPARTMENT_OTHER): Payer: Self-pay

## 2018-10-31 ENCOUNTER — Other Ambulatory Visit: Payer: Self-pay

## 2018-10-31 ENCOUNTER — Encounter (HOSPITAL_BASED_OUTPATIENT_CLINIC_OR_DEPARTMENT_OTHER): Payer: Self-pay

## 2018-10-31 ENCOUNTER — Emergency Department (HOSPITAL_BASED_OUTPATIENT_CLINIC_OR_DEPARTMENT_OTHER)
Admission: EM | Admit: 2018-10-31 | Discharge: 2018-10-31 | Disposition: A | Discharge status: Home / Self Care | Payer: Self-pay | Attending: Emergency Medicine | Admitting: Emergency Medicine

## 2018-10-31 DIAGNOSIS — E119 Type 2 diabetes mellitus without complications: Secondary | ICD-10-CM | POA: Insufficient documentation

## 2018-10-31 DIAGNOSIS — R197 Diarrhea, unspecified: Secondary | ICD-10-CM | POA: Insufficient documentation

## 2018-10-31 DIAGNOSIS — Z79899 Other long term (current) drug therapy: Secondary | ICD-10-CM | POA: Insufficient documentation

## 2018-10-31 DIAGNOSIS — R1013 Epigastric pain: Secondary | ICD-10-CM | POA: Insufficient documentation

## 2018-10-31 DIAGNOSIS — F17210 Nicotine dependence, cigarettes, uncomplicated: Secondary | ICD-10-CM | POA: Insufficient documentation

## 2018-10-31 DIAGNOSIS — R112 Nausea with vomiting, unspecified: Secondary | ICD-10-CM | POA: Insufficient documentation

## 2018-10-31 HISTORY — DX: Chronic obstructive pulmonary disease, unspecified: J44.9

## 2018-10-31 LAB — COMPREHENSIVE METABOLIC PANEL
ALT: 12 U/L (ref 0–44)
AST: 15 U/L (ref 15–41)
Albumin: 3.9 g/dL (ref 3.5–5.0)
Alkaline Phosphatase: 81 U/L (ref 38–126)
Anion gap: 6 (ref 5–15)
BILIRUBIN TOTAL: 0.2 mg/dL — AB (ref 0.3–1.2)
BUN: 14 mg/dL (ref 6–20)
CO2: 26 mmol/L (ref 22–32)
CREATININE: 0.77 mg/dL (ref 0.44–1.00)
Calcium: 8.7 mg/dL — ABNORMAL LOW (ref 8.9–10.3)
Chloride: 107 mmol/L (ref 98–111)
GFR calc Af Amer: >60 mL/min (ref >60)
GFR calc non Af Amer: >60 mL/min (ref >60)
Glucose, Bld: 90 mg/dL (ref 70–99)
Potassium: 3.5 mmol/L (ref 3.5–5.1)
Sodium: 139 mmol/L (ref 135–145)
Total Protein: 6.6 g/dL (ref 6.5–8.1)

## 2018-10-31 LAB — CBC WITH DIFFERENTIAL/PLATELET
Abs Immature Granulocytes: 0.05 10*3/uL (ref 0.00–0.07)
BASOS PCT: 1 %
Basophils Absolute: 0 10*3/uL (ref 0.0–0.1)
EOS PCT: 0 %
Eosinophils Absolute: 0 10*3/uL (ref 0.0–0.5)
HCT: 38.2 % (ref 36.0–46.0)
HEMOGLOBIN: 12.4 g/dL (ref 12.0–15.0)
Immature Granulocytes: 1 %
Lymphocytes Relative: 55 %
Lymphs Abs: 3.5 10*3/uL (ref 0.7–4.0)
MCH: 25.9 pg — AB (ref 26.0–34.0)
MCHC: 32.5 g/dL (ref 30.0–36.0)
MCV: 79.7 fL — AB (ref 80.0–100.0)
MONO ABS: 0.4 10*3/uL (ref 0.1–1.0)
MONOS PCT: 6 %
Neutro Abs: 2.4 10*3/uL (ref 1.7–7.7)
Neutrophils Relative %: 37 %
Platelets: 160 10*3/uL (ref 150–400)
RBC: 4.79 MIL/uL (ref 3.87–5.11)
RDW: 13.5 % (ref 11.5–15.5)
WBC: 6.3 10*3/uL (ref 4.0–10.5)
nRBC: 0 % (ref 0.0–0.2)

## 2018-10-31 LAB — LIPASE, BLOOD: Lipase: 26 U/L (ref 11–51)

## 2018-10-31 LAB — TROPONIN I: Troponin I: <0.03 ng/mL (ref <0.03)

## 2018-10-31 LAB — OCCULT BLOOD X 1 CARD TO LAB, STOOL: Fecal Occult Bld: NEGATIVE

## 2018-10-31 MED ORDER — OMEPRAZOLE 20 MG PO CPDR: 20.0000 mg | DELAYED_RELEASE_CAPSULE | Freq: Two times a day (BID) | ORAL | 0 refills | Status: DC

## 2018-10-31 MED ORDER — FAMOTIDINE IN NACL 20-0.9 MG/50ML-% IV SOLN
20.0000 mg | Freq: Once | INTRAVENOUS | Status: AC
  Administered 2018-10-31: 20 mg via INTRAVENOUS
  Filled 2018-10-31: qty 50

## 2018-10-31 MED ORDER — ONDANSETRON HCL 4 MG/2ML IJ SOLN
4.0000 mg | Freq: Once | INTRAMUSCULAR | Status: AC
  Administered 2018-10-31: 4 mg via INTRAVENOUS
  Filled 2018-10-31: qty 2

## 2018-10-31 MED ORDER — ONDANSETRON 4 MG PO TBDP: ORAL_TABLET | ORAL | 0 refills | Status: DC

## 2018-10-31 MED ORDER — SUCRALFATE 1 G PO TABS: 1.0000 g | ORAL_TABLET | Freq: Three times a day (TID) | ORAL | 0 refills | Status: DC

## 2018-10-31 MED ORDER — ALUM & MAG HYDROXIDE-SIMETH 200-200-20 MG/5ML PO SUSP
30.0000 mL | Freq: Once | ORAL | Status: AC
  Administered 2018-10-31: 30 mL via ORAL
  Filled 2018-10-31: qty 30

## 2018-10-31 MED ORDER — SODIUM CHLORIDE 0.9 % IV BOLUS
1000.0000 mL | Freq: Once | INTRAVENOUS | Status: AC
  Administered 2018-10-31: 1000 mL via INTRAVENOUS

## 2018-10-31 MED ORDER — MORPHINE SULFATE (PF) 4 MG/ML IV SOLN
4.0000 mg | Freq: Once | INTRAVENOUS | Status: AC
  Administered 2018-10-31: 4 mg via INTRAVENOUS
  Filled 2018-10-31: qty 1

## 2018-10-31 MED ORDER — LIDOCAINE VISCOUS HCL 2 % MT SOLN
15.0000 mL | Freq: Once | OROMUCOSAL | Status: AC
  Administered 2018-10-31: 15 mL via ORAL
  Filled 2018-10-31: qty 15

## 2018-10-31 NOTE — ED Triage Notes (Addendum)
C/o abd pain, n/v/d and constipation x 2 days-dx with flu at [facility] ED last week

## 2018-10-31 NOTE — Discharge Instructions (Addendum)
Your evaluation today is reassuring, labs and x-ray overall look good.  I suspect you have an inflammation of the lining of your stomach or possible ulcer that is causing your symptoms.  Please use acid reducer (Prilosec) twice daily before breakfast and bedtime, Carafate with each meal, Zofran as needed for nausea, you can also take Tylenol as needed for pain.  Please try and avoid ibuprofen or aspirin as these can worsen stomach irritation, avoid alcohol, spicy or acidic foods.  I would like for you to follow-up with your primary care doctor and GI doctor.  Return to the emergency department if you develop worsening abdominal pain, fevers, blood in your stool, persistent vomiting or unable to keep anything down or any other new or concerning symptoms.

## 2018-10-31 NOTE — ED Notes (Signed)
ED Provider at bedside. 

## 2018-10-31 NOTE — ED Provider Notes (Signed)
[facility] Provider Note   CSN: [id number] Arrival date & time: 10/31/18  1504     History   Chief Complaint Chief Complaint  Patient presents with  . Abdominal Pain    HPI [name] is a 57 y.o. female.  [name] is a 57 y.o. female with a history of diabetes and COPD, who presents to the emergency department for evaluation of 2 days of epigastric abdominal pain.  She reports pain is a constant ache primarily in the epigastric region but she does have some mild generalized abdominal pain.  She reports it feels like there is a knot in her stomach.  She denies any associated chest pain or shortness of breath.  She has had 2 episodes of vomiting, no blood in the vomit, she has had episodes of constipation as well as diarrhea, reports stools are somewhat black, no gross blood in the stools.  She denies any dysuria or urinary frequency.  Was diagnosed with flu last week and seemed to be improving from this finally when abdominal pain symptoms started 2 days ago.  No fevers or chills since flu has improved.  She has not taken anything to treat her symptoms, does report a history of "stomach infections".  Denies any other aggravating or alleviating factors.     Past Medical History:  Diagnosis Date  . COPD (chronic obstructive pulmonary disease) ([place])   . Diabetes mellitus without complication ([place])     There are no active problems to display for this patient.   Past Surgical History:  Procedure Laterality Date  . ABDOMINAL HYSTERECTOMY       OB History   No obstetric history on file.      Home Medications    Prior to Admission medications   Medication Sig Start Date End Date Taking? Authorizing Provider  albuterol (PROVENTIL HFA;VENTOLIN HFA) 108 (90 BASE) MCG/ACT inhaler Inhale 1-2 puffs into the lungs every 6 (six) hours as needed for wheezing. 07/12/13   Palumbo, April, MD  aspirin 81 MG tablet Take 81 mg by mouth daily.     [provider]  azithromycin (ZITHROMAX Z-PAK) 250 MG tablet 2 po day one, then 1 daily x 4 days 07/12/13   Palumbo, April, MD  fluticasone [facility]) 50 MCG/ACT nasal spray Place 2 sprays into the nose daily. 07/12/13   Palumbo, April, MD  loratadine (CLARITIN) 10 MG tablet Take 1 tablet (10 mg total) by mouth daily. 07/12/13   Palumbo, April, MD  metFORMIN (GLUCOPHAGE) 500 MG tablet Take 500 mg by mouth daily. 250mg  AM and 250mg  PM    [provider]  omeprazole (PRILOSEC) 20 MG capsule Take 1 capsule (20 mg total) by mouth 2 (two) times daily before a meal. 10/31/18   Jacqlyn Larsen, PA-C  ondansetron (ZOFRAN ODT) 4 MG disintegrating tablet 4mg  ODT q4 hours prn nausea/vomit 10/31/18   Jacqlyn Larsen, PA-C  polyethylene glycol [facility] / GLYCOLAX) packet Take 1 packet with one glass of water daily until bowels are moving well. 07/22/14   Molpus, John, MD  predniSONE (DELTASONE) 20 MG tablet 2 tabs po daily x 4 days 07/12/13   Palumbo, April, MD  sucralfate (CARAFATE) 1 g tablet Take 1 tablet (1 g total) by mouth 4 (four) times daily -  with meals and at bedtime. 10/31/18   Jacqlyn Larsen, PA-C    Family History No family history on file.  Social History Social History   Tobacco Use  .  Smoking status: Current Some Day Smoker    Types: Cigarettes  . Smokeless tobacco: Never Used  Substance Use Topics  . Alcohol use: Yes    Comment: occasional   . Drug use: Yes    Types: Marijuana     Allergies   Gabapentin; Penicillins; and Sulfa antibiotics   Review of Systems Review of Systems  Constitutional: Negative for chills and fever.  HENT: Negative for congestion, rhinorrhea and sore throat.   Eyes: Negative for visual disturbance.  Respiratory: Negative for cough and shortness of breath.   Cardiovascular: Negative for chest pain.  Gastrointestinal: Positive for abdominal pain, constipation, diarrhea, nausea and vomiting. Negative for blood in stool.  Genitourinary:  Negative for dysuria, flank pain and frequency.  Musculoskeletal: Negative for arthralgias and myalgias.  Skin: Negative for color change and rash.  Neurological: Negative for dizziness, syncope and light-headedness.     Physical Exam Updated Vital Signs BP (!) 126/93 (BP Location: Left Arm)   Pulse 89   Temp 98.2 F (36.8 C) (Oral)   Resp 20   Ht 5\' 2"  (1.575 m)   Wt 72.5 kg   SpO2 99%   BMI 29.23 kg/m   Physical Exam Vitals signs and nursing note reviewed.  Constitutional:      General: She is not in acute distress.    Appearance: She is well-developed. She is not ill-appearing or diaphoretic.  HENT:     Head: Normocephalic and atraumatic.     Mouth/Throat:     Mouth: Mucous membranes are moist.     Pharynx: Oropharynx is clear. No pharyngeal swelling or oropharyngeal exudate.  Eyes:     General:        Right eye: No discharge.        Left eye: No discharge.  Neck:     Musculoskeletal: Neck supple.  Cardiovascular:     Rate and Rhythm: Normal rate and regular rhythm.     Heart sounds: Normal heart sounds. No murmur. No friction rub. No gallop.   Pulmonary:     Effort: Pulmonary effort is normal. No respiratory distress.     Breath sounds: Normal breath sounds.     Comments: Respirations equal and unlabored, patient able to speak in full sentences, lungs clear to auscultation bilaterally Abdominal:     General: Abdomen is flat. Bowel sounds are normal. There is no distension.     Palpations: Abdomen is soft.     Tenderness: There is abdominal tenderness in the epigastric area. There is guarding. There is no right CVA tenderness, left CVA tenderness or rebound.     Comments: Abdomen soft and nondistended, bowel sounds present throughout, there is localized tenderness in the epigastric region with mild guarding, no rigidity or rebound tenderness, abdomen is otherwise nontender to palpation.  No CVA tenderness bilaterally.  Musculoskeletal:        General: No deformity.   Skin:    General: Skin is warm and dry.     Capillary Refill: Capillary refill takes less than 2 seconds.  Neurological:     General: No focal deficit present.     Mental Status: She is alert and oriented to person, place, and time.     Coordination: Coordination normal.  Psychiatric:        Mood and Affect: Mood normal.        Behavior: Behavior normal.      ED Treatments / Results  Labs (all labs ordered are listed, but only abnormal results are displayed)  Labs Reviewed  COMPREHENSIVE METABOLIC PANEL - Abnormal; Notable for the following components:      Result Value   Calcium 8.7 (*)    Total Bilirubin 0.2 (*)    All other components within normal limits  CBC WITH DIFFERENTIAL/PLATELET - Abnormal; Notable for the following components:   MCV 79.7 (*)    MCH 25.9 (*)    All other components within normal limits  LIPASE, BLOOD  TROPONIN I  OCCULT BLOOD X 1 CARD TO LAB, STOOL  POC OCCULT BLOOD, ED    EKG EKG Interpretation  Date/Time:  Friday October 31 2018 15:56:36 EST Ventricular Rate:  67 PR Interval:    QRS Duration: 97 QT Interval:  409 QTC Calculation: 432 R Axis:   47 Text Interpretation:  Sinus rhythm Low voltage, precordial leads Abnormal R-wave progression, early transition Baseline wander in lead(s) I II III aVL aVF since last tracing no significant change Confirmed by Malvin Johns [phone]) on 10/31/2018 4:15:00 PM   Radiology Dg Abdomen Acute W/chest  Result Date: 10/31/2018 CLINICAL DATA:  Epigastric pain with diarrhea EXAM: DG ABDOMEN ACUTE W/ 1V CHEST COMPARISON:  Chest x-ray 10/21/2018 FINDINGS: Lungs are clear and without interval change. No acute abnormality in the chest Nonobstructive bowel gas pattern. No free air. Moderate stool in the colon mostly in the right colon. Cholecystectomy clips. IMPRESSION: No active cardiopulmonary disease Moderate stool in the right colon. Nonobstructive bowel gas pattern. Electronically Signed   By: Franchot Gallo M.D.   On: 10/31/2018 16:33    Procedures Procedures (including critical care time)  Medications Ordered in ED Medications  sodium chloride 0.9 % bolus 1,000 mL (0 mLs Intravenous Stopped 10/31/18 1702)  famotidine (PEPCID) IVPB 20 mg premix (0 mg Intravenous Stopped 10/31/18 1702)  ondansetron (ZOFRAN) injection 4 mg (4 mg Intravenous Given 10/31/18 1557)  morphine 4 MG/ML injection 4 mg (4 mg Intravenous Given 10/31/18 1557)  alum & mag hydroxide-simeth (MAALOX/MYLANTA) 200-200-20 MG/5ML suspension 30 mL (30 mLs Oral Given 10/31/18 1733)    And  lidocaine (XYLOCAINE) 2 % viscous mouth solution 15 mL (15 mLs Oral Given 10/31/18 1733)     Initial Impression / Assessment and Plan / ED Course  I have reviewed the triage vital signs and the nursing notes.  Pertinent labs & imaging results that were available during my care of the patient were reviewed by me and considered in my medical decision making (see chart for details).  Patient presents for evaluation of epigastric abdominal pain with associated nausea, vomiting and diarrhea.  Symptoms have been present for the past 2 days.  Patient had influenza last week but recovered from this and was feeling better until symptoms started 2 days ago.  No fevers or chills.  Patient does report history of "stomach infections".  Does not have a GI doctor she follows with.  Has not had any blood in her emesis or stool.  On arrival she is well-appearing with normal vitals.  No associated chest pain or shortness of breath and heart with regular rate and rhythm and lungs clear to auscultation she has focal tenderness in the epigastric area with mild guarding.  Hemoccult is not grossly positive and there is soft brown stool present in the rectal vault.  Will get abdominal labs as well as troponin, EKG and will get acute abdominal series x-rays to evaluate the chest and abdomen.  Will give IV fluids, Pepcid, Zofran and morphine for symptomatic  management.  Lab work is overall reassuring,  no leukocytosis and normal hemoglobin, no acute electrolyte derangements and normal renal and liver function.  Normal lipase.  Negative troponin and EKG without any concerning ischemic changes.  Hemoccult is negative.  Acute abdominal x-ray showed no acute cardiopulmonary disease, there is a normal bowel gas pattern with no evidence of obstruction and no free air to suggest perforation.  Moderate amount of stool present in the colon.  On reevaluation patient reports her symptoms have significantly improved she still having some mild epigastric discomfort but it is much better than it was on arrival, she is tolerating p.o. fluids with no further vomiting.  Will give GI cocktail, but feel patient is stable for discharge home with follow-up with her PCP and GI doctor.  Feel patient likely has gastritis or peptic ulcer disease causing her symptoms.  Will be discharged home with Zofran, PPI, Carafate, instructed on dietary modifications and to avoid NSAIDs and alcohol.  Return precautions discussed.  Patient expresses understanding and agreement with plan, stable for discharge home in good condition.  Final Clinical Impressions(s) / ED Diagnoses   Final diagnoses:  Epigastric pain  Nausea vomiting and diarrhea    ED Discharge Orders         Ordered    ondansetron (ZOFRAN ODT) 4 MG disintegrating tablet     10/31/18 1725    sucralfate (CARAFATE) 1 g tablet  3 times daily with meals & bedtime     10/31/18 1725    omeprazole (PRILOSEC) 20 MG capsule  2 times daily before meals     10/31/18 1725           Jacqlyn Larsen, [place] 11/01/18 1623    Malvin Johns, MD 11/01/18 [phone]

## 2019-08-24 IMAGING — CR DG ABDOMEN ACUTE W/ 1V CHEST
3 series · 3 of 3 positions shown · non-contrast
Comparison: Chest x-ray 10/21/2018

CLINICAL DATA: Epigastric pain with diarrhea

EXAM:
DG ABDOMEN ACUTE W/ 1V CHEST

[w chest pa]
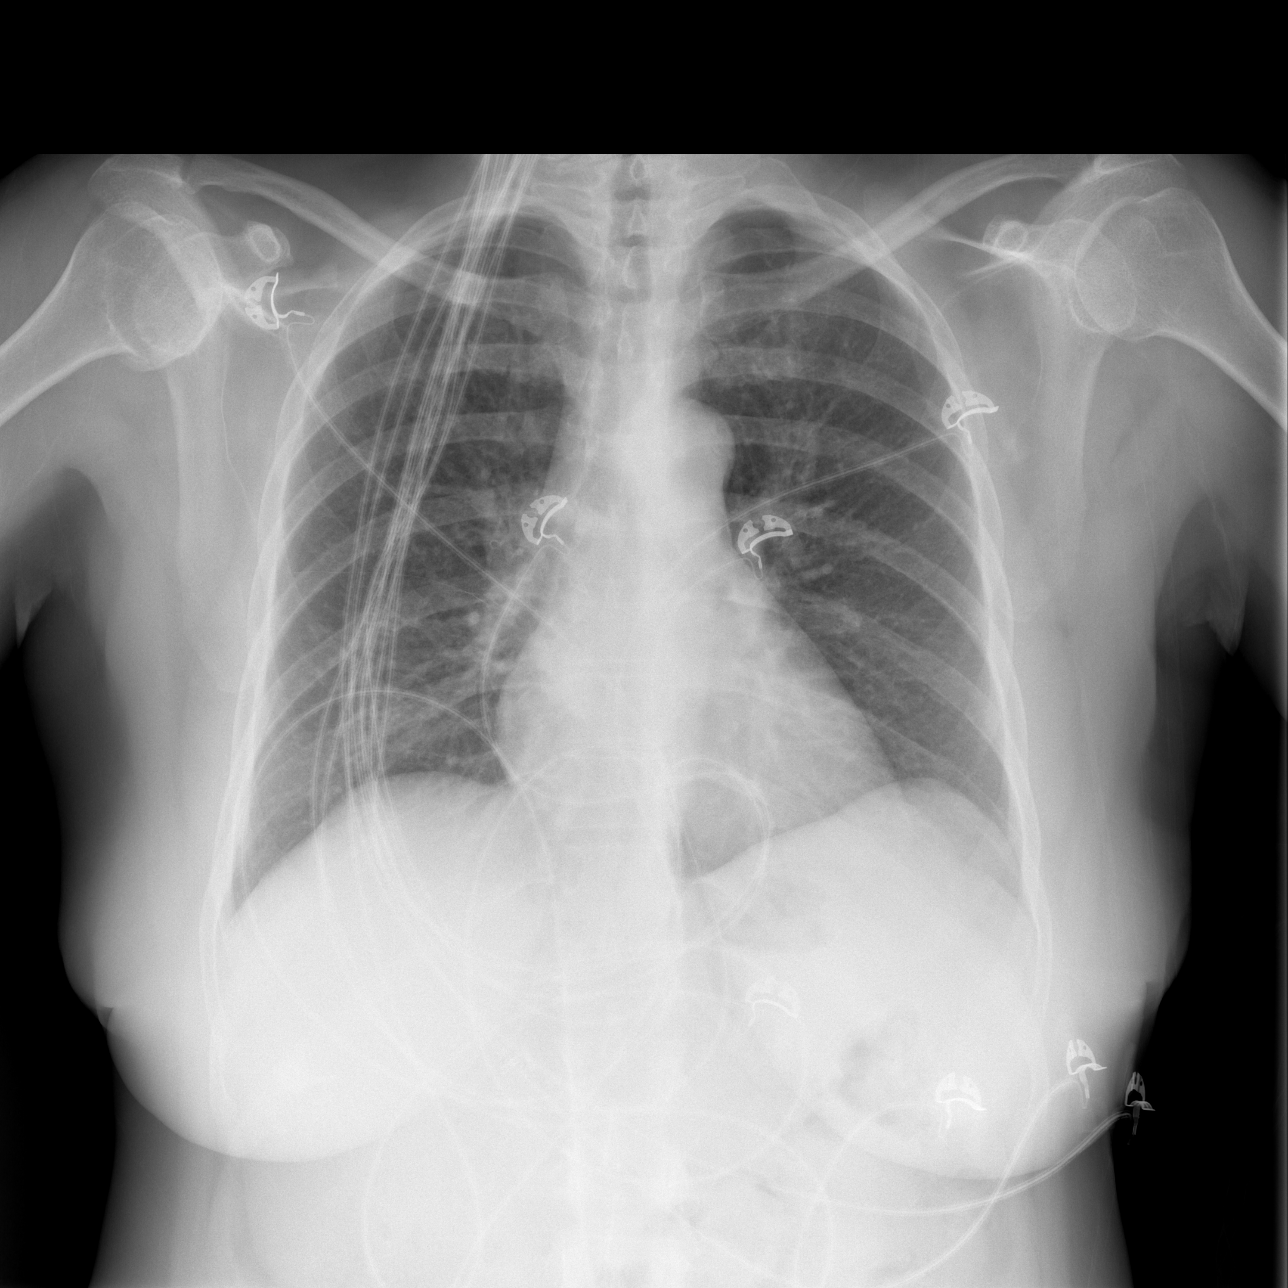

[w abdomen upright]
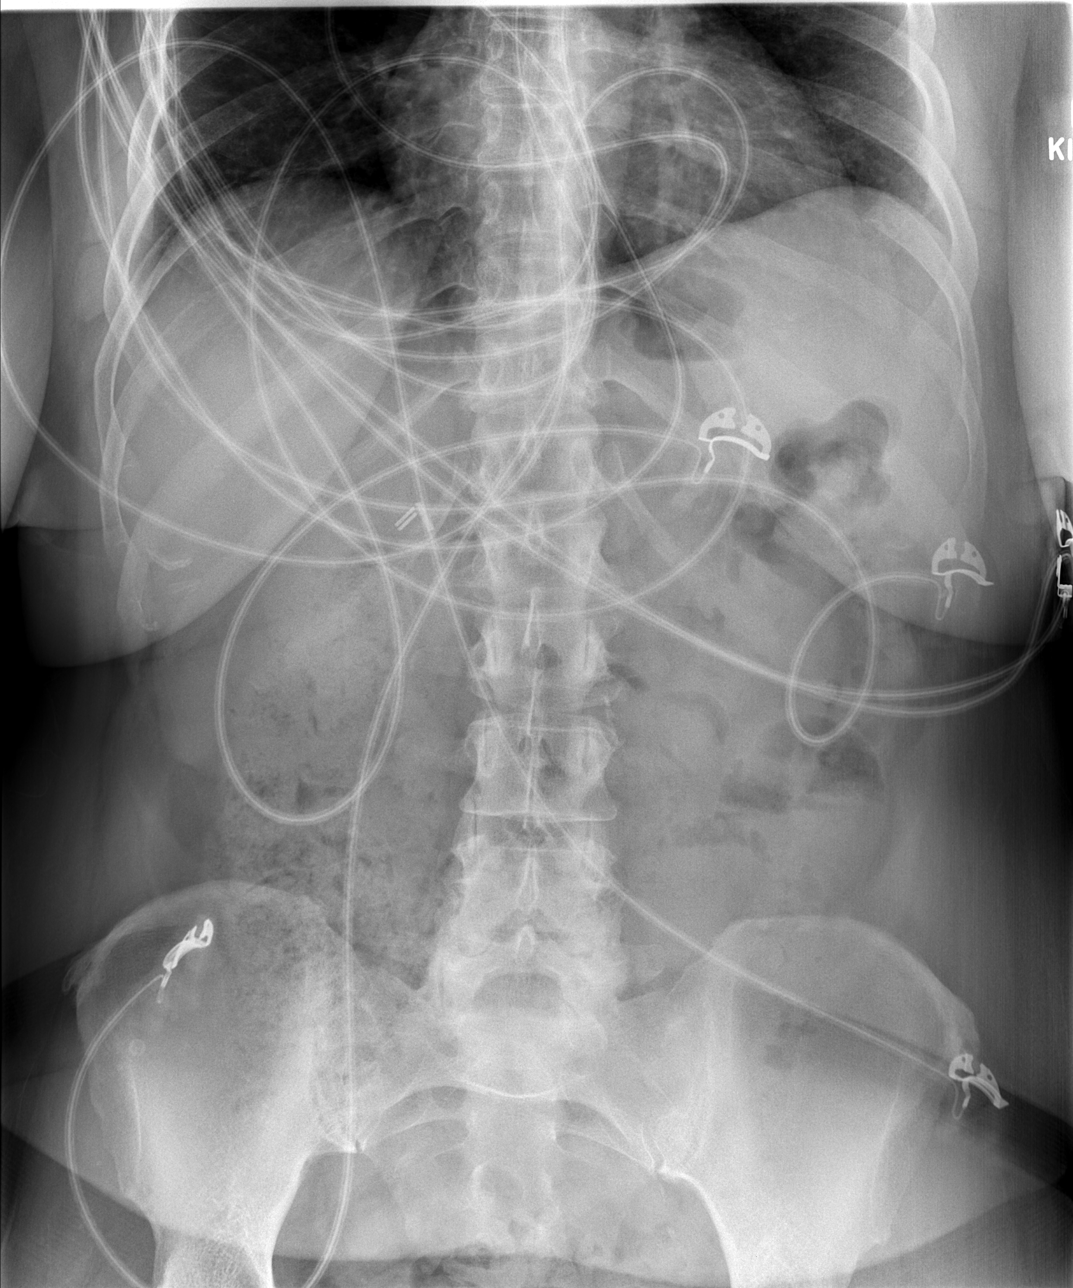

[t abdomen supine]
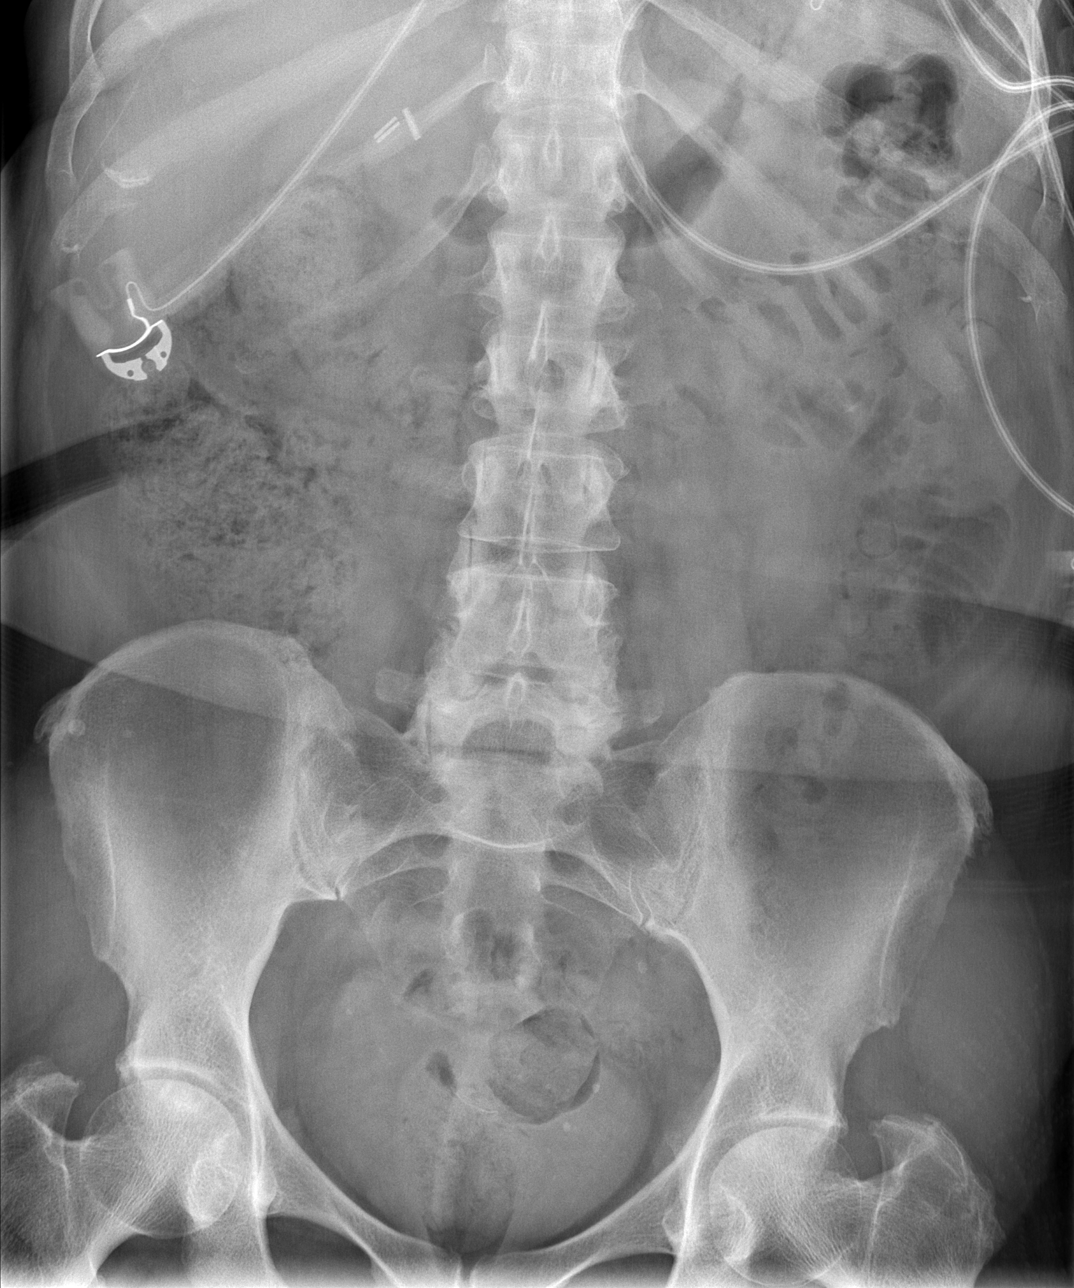

[3 of 3 positions shown; findings below may reference images not displayed]

FINDINGS: Lungs are clear and without interval change. No acute abnormality in
the chest

Nonobstructive bowel gas pattern. No free air. Moderate stool in the
colon mostly in the right colon. Cholecystectomy clips.
IMPRESSION: No active cardiopulmonary disease

Moderate stool in the right colon. Nonobstructive bowel gas pattern.

## 2019-09-01 ENCOUNTER — Encounter: Payer: Self-pay | Admitting: Nurse Practitioner

## 2019-09-01 ENCOUNTER — Ambulatory Visit: Payer: Self-pay | Attending: Nurse Practitioner | Admitting: Nurse Practitioner

## 2019-09-01 ENCOUNTER — Other Ambulatory Visit: Payer: Self-pay

## 2019-09-01 DIAGNOSIS — Z8639 Personal history of other endocrine, nutritional and metabolic disease: Secondary | ICD-10-CM

## 2019-09-01 DIAGNOSIS — F17200 Nicotine dependence, unspecified, uncomplicated: Secondary | ICD-10-CM

## 2019-09-01 DIAGNOSIS — F17210 Nicotine dependence, cigarettes, uncomplicated: Secondary | ICD-10-CM

## 2019-09-01 DIAGNOSIS — G8929 Other chronic pain: Secondary | ICD-10-CM

## 2019-09-01 DIAGNOSIS — Z13220 Encounter for screening for lipoid disorders: Secondary | ICD-10-CM

## 2019-09-01 DIAGNOSIS — R1013 Epigastric pain: Secondary | ICD-10-CM

## 2019-09-01 MED ORDER — ALBUTEROL SULFATE HFA 108 (90 BASE) MCG/ACT IN AERS: 1.0000 | INHALATION_SPRAY | Freq: Four times a day (QID) | RESPIRATORY_TRACT | 1 refills | Status: DC | PRN

## 2019-09-01 MED ORDER — OMEPRAZOLE 20 MG PO CPDR: 20.0000 mg | DELAYED_RELEASE_CAPSULE | Freq: Two times a day (BID) | ORAL | 3 refills | Status: DC

## 2019-09-01 MED ORDER — FAMOTIDINE 20 MG PO TABS: 20.0000 mg | ORAL_TABLET | Freq: Two times a day (BID) | ORAL | 2 refills | Status: DC

## 2019-09-01 MED ORDER — DICYCLOMINE HCL 20 MG PO TABS: 20.0000 mg | ORAL_TABLET | Freq: Three times a day (TID) | ORAL | 3 refills | Status: AC

## 2019-09-01 MED FILL — DICYCLOMINE 20 MG TABLET: 20 | 30 days supply | Qty: 120 | Fill #0

## 2019-09-01 MED FILL — !VENTOLIN HFA INHALER: 108 (90 BAS | 25 days supply | Qty: 18 | Fill #0

## 2019-09-01 MED FILL — OMEPRAZOLE 20 MG CAP: 20 | 30 days supply | Qty: 60 | Fill #0

## 2019-09-01 NOTE — Progress Notes (Signed)
Virtual Visit via Telephone Note Due to national recommendations of social distancing due to [place] 19, telehealth visit is felt to be most appropriate for this patient at this time.  I discussed the limitations, risks, security and privacy concerns of performing an evaluation and management service by telephone and the availability of in person appointments. I also discussed with the patient that there may be a patient responsible charge related to this service. The patient expressed understanding and agreed to proceed.    I connected with Olena Mater on 09/01/19  at   8:50 AM EDT  EDT by telephone and verified that I am speaking with the correct person using two identifiers.   Consent I discussed the limitations, risks, security and privacy concerns of performing an evaluation and management service by telephone and the availability of in person appointments. I also discussed with the patient that there may be a patient responsible charge related to this service. The patient expressed understanding and agreed to proceed.   Location of Patient: Private Residence   Location of Provider: [place] [facility] [employer] Office    Persons participating in Telemedicine visit: Geryl Rankins FNP-BC [place]    History of Present Illness: Telemedicine visit for: Establish Care  has a past medical history of COPD (chronic obstructive pulmonary disease) ([place]) and Diabetes mellitus without complication ([place]).   Abdominal Pain: Patient complains of abdominal pain. The pain is described as aching, cramping, pressure-like and sharp, and is 8/10 in intensity. Pain is located in the epigastric without radiation. Onset was several years ago. Symptoms have been waxing and waning since. Aggravating factors: fatty foods, NSAIDs and spicy foods.  Alleviating factors: H2 blockers and proton pump inhibitors. Associated symptoms: constipation, diarrhea and nausea. The patient denies  fever, hematochezia, melena and vomiting. CONTINUES TO SMOKE.  She has a history of being lost to follow due to lack of insurance and compliance. States she had a colonoscopy 3 years however there is no evidence of this in our system. She has symptoms of IBS: with alternating diarrhea and constipation.  She also has a ventral hernia per her report.  Has lost 45lbs due to GERD symptoms. Had lap chole 10-2015 due to acute calculous cholecystitis. She has seen a gastroenterologist in the past. Tried to get disability and medicaid but was denied. She states she lost her job due to her GI issues.  Previous abdominal xrays have shown moderate stool burden.  She will need to be evaluated by GI.    Tobacco Dependence Smoking 7-8 cigarettes per day. Down from 1/2 ppd. Dipped snuff since 58 years old and stopped in her 71s and then started smoking cigarettes.    Past Medical History:  Diagnosis Date  . COPD (chronic obstructive pulmonary disease) ([place])   . Diabetes mellitus without complication [facility])     Past Surgical History:  Procedure Laterality Date  . ABDOMINAL HYSTERECTOMY     partial  . CHOLECYSTECTOMY      Family History  Problem Relation Age of Onset  . Diabetes Mother   . Lung cancer Father     Social History   Socioeconomic History  . Marital status: Divorced    Spouse name: Not on file  . Number of children: Not on file  . Years of education: Not on file  . Highest education level: Not on file  Occupational History  . Not on file  Social Needs  . Financial resource strain: Not on file  .  Food insecurity    Worry: Not on file    Inability: Not on file  . Transportation needs    Medical: Not on file    Non-medical: Not on file  Tobacco Use  . Smoking status: Current Some Day Smoker    Types: Cigarettes  . Smokeless tobacco: Never Used  Substance and Sexual Activity  . Alcohol use: Yes    Comment: occasional   . Drug use: Yes    Types: Marijuana  . Sexual  activity: Not on file  Lifestyle  . Physical activity    Days per week: Not on file    Minutes per session: Not on file  . Stress: Not on file  Relationships  . Social Herbalist on phone: Not on file    Gets together: Not on file    Attends religious service: Not on file    Active member of club or organization: Not on file    Attends meetings of clubs or organizations: Not on file    Relationship status: Not on file  Other Topics Concern  . Not on file  Social History Narrative  . Not on file     Observations/Objective: Awake, alert and oriented x 3   Review of Systems  Constitutional: Negative for fever, malaise/fatigue and weight loss.  HENT: Negative.  Negative for nosebleeds.   Eyes: Negative.  Negative for blurred vision, double vision and photophobia.  Respiratory: Negative.  Negative for cough and shortness of breath.   Cardiovascular: Negative.  Negative for chest pain, palpitations and leg swelling.  Gastrointestinal: Positive for abdominal pain, constipation, diarrhea, heartburn and nausea. Negative for blood in stool, melena and vomiting.  Genitourinary: Negative.   Musculoskeletal: Negative.  Negative for myalgias.  Neurological: Negative.  Negative for dizziness, focal weakness, seizures and headaches.  Psychiatric/Behavioral: Negative.  Negative for suicidal ideas.    Assessment and Plan Diagnoses and all orders for this visit:  Abdominal pain, chronic, epigastric -     Ambulatory referral to Gastroenterology -     dicyclomine (BENTYL) 20 MG tablet; Take 1 tablet (20 mg total) by mouth 4 (four) times daily -  before meals and at bedtime. -     famotidine (PEPCID) 20 MG tablet; Take 1 tablet (20 mg total) by mouth 2 (two) times daily. -     omeprazole (PRILOSEC) 20 MG capsule; Take 1 capsule (20 mg total) by mouth 2 (two) times daily before a meal. -     CBC; Future -     CMP14+EGFR; Future -     H.pylori screen, POC; Future  Tobacco  dependence -     albuterol (VENTOLIN HFA) 108 (90 Base) MCG/ACT inhaler; Inhale 1-2 puffs into the lungs every 6 (six) hours as needed for wheezing (COUGH). Kalei was counseled on the dangers of tobacco use, and was advised to quit. Reviewed strategies to maximize success, including removing cigarettes and smoking materials from environment, stress management and support of family/friends as well as pharmacological alternatives including: Wellbutrin, Chantix, Nicotine patch, Nicotine gum or lozenges. Smoking cessation support: smoking cessation hotline: 1-800-QUIT-NOW.  Smoking cessation classes are also available through [facility]. Call [phone] or visit our website at [email or web address]   A total of 3 minutes was spent on counseling for smoking cessation and Katharin is not ready to quit.   Lipid screening -     Lipid panel; Future  History of diabetes mellitus -     Hemoglobin A1c;  Future Patient states she has a history of DM but was taken off metformin due to low A1c.    Follow Up Instructions Return in about 4 weeks (around 09/29/2019).     I discussed the assessment and treatment plan with the patient. The patient was provided an opportunity to ask questions and all were answered. The patient agreed with the plan and demonstrated an understanding of the instructions.   The patient was advised to call back or seek an in-person evaluation if the symptoms worsen or if the condition fails to improve as anticipated.  I provided 24 minutes of non-face-to-face time during this encounter including median intraservice time, reviewing previous notes, labs, imaging, medications and explaining diagnosis and management.  Gildardo Pounds, FNP-BC

## 2019-09-03 ENCOUNTER — Other Ambulatory Visit: Payer: Self-pay

## 2019-09-03 ENCOUNTER — Ambulatory Visit: Payer: Self-pay | Attending: Nurse Practitioner

## 2019-09-03 DIAGNOSIS — G8929 Other chronic pain: Secondary | ICD-10-CM

## 2019-09-03 DIAGNOSIS — Z13220 Encounter for screening for lipoid disorders: Secondary | ICD-10-CM

## 2019-09-03 DIAGNOSIS — Z8639 Personal history of other endocrine, nutritional and metabolic disease: Secondary | ICD-10-CM

## 2019-09-03 DIAGNOSIS — R1013 Epigastric pain: Secondary | ICD-10-CM

## 2019-09-04 LAB — CMP14+EGFR
ALT: 15 IU/L (ref 0–32)
AST: 16 IU/L (ref 0–40)
Albumin/Globulin Ratio: 2.1 (ref 1.2–2.2)
Albumin: 4.4 g/dL (ref 3.8–4.9)
Alkaline Phosphatase: 133 IU/L — ABNORMAL HIGH (ref 39–117)
BUN/Creatinine Ratio: 18 (ref 9–23)
BUN: 14 mg/dL (ref 6–24)
Bilirubin Total: <0.2 mg/dL (ref 0.0–1.2)
CO2: 25 mmol/L (ref 20–29)
Calcium: 9.5 mg/dL (ref 8.7–10.2)
Chloride: 103 mmol/L (ref 96–106)
Creatinine, Ser: 0.76 mg/dL (ref 0.57–1.00)
GFR calc Af Amer: 100 mL/min/{1.73_m2} (ref >59)
GFR calc non Af Amer: 87 mL/min/{1.73_m2} (ref >59)
Globulin, Total: 2.1 g/dL (ref 1.5–4.5)
Glucose: 87 mg/dL (ref 65–99)
Potassium: 4.1 mmol/L (ref 3.5–5.2)
Sodium: 142 mmol/L (ref 134–144)
Total Protein: 6.5 g/dL (ref 6.0–8.5)

## 2019-09-04 LAB — LIPID PANEL
Chol/HDL Ratio: 2.9 ratio (ref 0.0–4.4)
Cholesterol, Total: 152 mg/dL (ref 100–199)
HDL: 52 mg/dL (ref >39)
LDL Chol Calc (NIH): 79 mg/dL (ref 0–99)
Triglycerides: 119 mg/dL (ref 0–149)
VLDL Cholesterol Cal: 21 mg/dL (ref 5–40)

## 2019-09-04 LAB — CBC
Hematocrit: 42 % (ref 34.0–46.6)
Hemoglobin: 14.1 g/dL (ref 11.1–15.9)
MCH: 27.8 pg (ref 26.6–33.0)
MCHC: 33.6 g/dL (ref 31.5–35.7)
MCV: 83 fL (ref 79–97)
Platelets: 139 10*3/uL — ABNORMAL LOW (ref 150–450)
RBC: 5.07 x10E6/uL (ref 3.77–5.28)
RDW: 13.5 % (ref 11.7–15.4)
WBC: 5.8 10*3/uL (ref 3.4–10.8)

## 2019-09-04 LAB — HEMOGLOBIN A1C
Est. average glucose Bld gHb Est-mCnc: 120 mg/dL
Hgb A1c MFr Bld: 5.8 % — ABNORMAL HIGH (ref 4.8–5.6)

## 2019-09-07 ENCOUNTER — Telehealth: Payer: Self-pay

## 2019-09-07 NOTE — Telephone Encounter (Signed)
ROI fax to [facility] for records

## 2019-09-08 ENCOUNTER — Other Ambulatory Visit: Payer: Self-pay

## 2019-09-08 DIAGNOSIS — G8929 Other chronic pain: Secondary | ICD-10-CM

## 2019-09-18 ENCOUNTER — Encounter: Payer: Self-pay | Admitting: Gastroenterology

## 2019-09-21 ENCOUNTER — Ambulatory Visit: Payer: Self-pay | Admitting: Gastroenterology

## 2019-09-29 ENCOUNTER — Ambulatory Visit: Payer: Self-pay | Attending: Nurse Practitioner

## 2019-09-29 ENCOUNTER — Other Ambulatory Visit: Payer: Self-pay

## 2019-09-29 ENCOUNTER — Ambulatory Visit: Payer: Self-pay

## 2019-09-29 DIAGNOSIS — G8929 Other chronic pain: Secondary | ICD-10-CM

## 2019-09-30 LAB — CBC
Hematocrit: 42.5 % (ref 34.0–46.6)
Hemoglobin: 14.2 g/dL (ref 11.1–15.9)
MCH: 27.4 pg (ref 26.6–33.0)
MCHC: 33.4 g/dL (ref 31.5–35.7)
MCV: 82 fL (ref 79–97)
Platelets: 163 10*3/uL (ref 150–450)
RBC: 5.19 x10E6/uL (ref 3.77–5.28)
RDW: 13.4 % (ref 11.7–15.4)
WBC: 7.8 10*3/uL (ref 3.4–10.8)

## 2019-10-05 ENCOUNTER — Ambulatory Visit: Payer: Self-pay | Admitting: Gastroenterology

## 2019-11-10 ENCOUNTER — Ambulatory Visit: Payer: Self-pay | Attending: Nurse Practitioner | Admitting: Nurse Practitioner

## 2019-11-10 ENCOUNTER — Encounter: Payer: Self-pay | Admitting: Nurse Practitioner

## 2019-11-10 ENCOUNTER — Other Ambulatory Visit: Payer: Self-pay

## 2019-11-10 DIAGNOSIS — G8929 Other chronic pain: Secondary | ICD-10-CM

## 2019-11-10 DIAGNOSIS — R1013 Epigastric pain: Secondary | ICD-10-CM

## 2019-11-10 NOTE — Progress Notes (Signed)
Virtual Visit via Telephone Note Due to national recommendations of social distancing due to [place] 19, telehealth visit is felt to be most appropriate for this patient at this time.  I discussed the limitations, risks, security and privacy concerns of performing an evaluation and management service by telephone and the availability of in person appointments. I also discussed with the patient that there may be a patient responsible charge related to this service. The patient expressed understanding and agreed to proceed.    I connected with [name] on 11/10/19  at   4:10 PM EST  EDT by telephone and verified that I am speaking with the correct person using two identifiers.   Consent I discussed the limitations, risks, security and privacy concerns of performing an evaluation and management service by telephone and the availability of in person appointments. I also discussed with the patient that there may be a patient responsible charge related to this service. The patient expressed understanding and agreed to proceed.   Location of Patient: Private Residence   Location of Provider: [place] [facility] [place] participating in Telemedicine visit: Geryl Rankins FNP-BC [place]    History of Present Illness: Telemedicine visit for: F/U abdominal pain  Endorses improvement of abdominal pain, bloating, cramping and nausea with bentyl and omeprazole which I prescribed for her on October 20 th. She is only taking these 2 medications as needed. States her weight is increasing and unintentional weight loss is no longer a factor. She does continue to experience intermittent diarrhea. Needs to follow up with Gi for possible IBS. She is also overdue for colonoscopy. She canceled her recent appt with GI as she is waiting for her insurance to be approved.     Past Medical History:  Diagnosis Date  . COPD (chronic obstructive pulmonary disease)  ([place])   . Diabetes mellitus without complication [facility])     Past Surgical History:  Procedure Laterality Date  . ABDOMINAL HYSTERECTOMY     partial  . CHOLECYSTECTOMY      Family History  Problem Relation Age of Onset  . Diabetes Mother   . Lung cancer Father     Social History   Socioeconomic History  . Marital status: Divorced    Spouse name: Not on file  . Number of children: Not on file  . Years of education: Not on file  . Highest education level: Not on file  Occupational History  . Not on file  Tobacco Use  . Smoking status: Current Some Day Smoker    Types: Cigarettes  . Smokeless tobacco: Never Used  Substance and Sexual Activity  . Alcohol use: Yes    Comment: occasional   . Drug use: Yes    Types: Marijuana  . Sexual activity: Not on file  Other Topics Concern  . Not on file  Social History Narrative  . Not on file   Social Determinants of Health   Financial Resource Strain:   . Difficulty of Paying Living Expenses: Not on file  Food Insecurity:   . Worried About Charity fundraiser in the Last Year: Not on file  . Ran Out of Food in the Last Year: Not on file  Transportation Needs:   . Lack of Transportation (Medical): Not on file  . Lack of Transportation (Non-Medical): Not on file  Physical Activity:   . Days of Exercise per Week: Not on file  . Minutes of Exercise per  Session: Not on file  Stress:   . Feeling of Stress : Not on file  Social Connections:   . Frequency of Communication with Friends and Family: Not on file  . Frequency of Social Gatherings with Friends and Family: Not on file  . Attends Religious Services: Not on file  . Active Member of Clubs or Organizations: Not on file  . Attends Archivist Meetings: Not on file  . Marital Status: Not on file     Observations/Objective: Awake, alert and oriented x 3   Review of Systems  Constitutional: Negative for fever, malaise/fatigue and weight loss.  HENT: Negative.   Negative for nosebleeds.   Eyes: Negative.  Negative for blurred vision, double vision and photophobia.  Respiratory: Negative.  Negative for cough and shortness of breath.   Cardiovascular: Negative.  Negative for chest pain, palpitations and leg swelling.  Gastrointestinal: Positive for abdominal pain, diarrhea and heartburn. Negative for blood in stool, constipation, melena, nausea and vomiting.  Musculoskeletal: Negative.  Negative for myalgias.  Neurological: Negative.  Negative for dizziness, focal weakness, seizures and headaches.  Psychiatric/Behavioral: Negative.  Negative for suicidal ideas.    Assessment and Plan: [name] was seen today for follow-up.  Diagnoses and all orders for this visit:  Abdominal pain, chronic, epigastric Chronic, improved and stable   Follow Up Instructions Return in about 3 months (around 02/08/2020).     I discussed the assessment and treatment plan with the patient. The patient was provided an opportunity to ask questions and all were answered. The patient agreed with the plan and demonstrated an understanding of the instructions.   The patient was advised to call back or seek an in-person evaluation if the symptoms worsen or if the condition fails to improve as anticipated.  I provided 16 minutes of non-face-to-face time during this encounter including median intraservice time, reviewing previous notes, labs, imaging, medications and explaining diagnosis and management.  Gildardo Pounds, FNP-BC

## 2019-11-19 ENCOUNTER — Telehealth: Payer: Self-pay | Admitting: Nurse Practitioner

## 2019-11-19 NOTE — Telephone Encounter (Signed)
Pt was called to informed her that she is approve for the CAFA an I will sent a copy of the letter by mail today

## 2019-11-25 ENCOUNTER — Encounter: Payer: Self-pay | Admitting: Gastroenterology

## 2019-12-10 ENCOUNTER — Encounter: Payer: Self-pay | Admitting: Gastroenterology

## 2019-12-10 ENCOUNTER — Ambulatory Visit (INDEPENDENT_AMBULATORY_CARE_PROVIDER_SITE_OTHER): Payer: Self-pay | Admitting: Gastroenterology

## 2019-12-10 ENCOUNTER — Other Ambulatory Visit: Payer: Self-pay

## 2019-12-10 ENCOUNTER — Telehealth: Payer: Self-pay | Admitting: Gastroenterology

## 2019-12-10 VITALS — BP 118/82 | HR 85 | Temp 97.3°F | Ht 62.0 in | Wt 157.0 lb

## 2019-12-10 DIAGNOSIS — K219 Gastro-esophageal reflux disease without esophagitis: Secondary | ICD-10-CM

## 2019-12-10 DIAGNOSIS — R195 Other fecal abnormalities: Secondary | ICD-10-CM

## 2019-12-10 DIAGNOSIS — Z01818 Encounter for other preprocedural examination: Secondary | ICD-10-CM

## 2019-12-10 DIAGNOSIS — K439 Ventral hernia without obstruction or gangrene: Secondary | ICD-10-CM

## 2019-12-10 DIAGNOSIS — R110 Nausea: Secondary | ICD-10-CM

## 2019-12-10 DIAGNOSIS — Z86010 Personal history of colonic polyps: Secondary | ICD-10-CM

## 2019-12-10 DIAGNOSIS — R1013 Epigastric pain: Secondary | ICD-10-CM

## 2019-12-10 MED ORDER — CLENPIQ 10-3.5-12 MG-GM -GM/160ML PO SOLN: 1.0000 | Freq: Once | ORAL | 0 refills | Status: DC

## 2019-12-10 NOTE — Patient Instructions (Signed)
You have been scheduled for an endoscopy and colonoscopy. Please follow the written instructions given to you at your visit today. Please pick up your prep supplies at the pharmacy within the next 1-3 days. If you use inhalers (even only as needed), please bring them with you on the day of your procedure. Your physician has requested that you go to www.startemmi.com and enter the access code given to you at your visit today. This web site gives a general overview about your procedure. However, you should still follow specific instructions given to you by our office regarding your preparation for the procedure.  We have sent a referral to [facility] at [address] [place], [place] [address]. [phone]. You will be receiving a call to schedule an appointment with them soon.  Return in 3-6 months  It was a pleasure to see you today!  Vito Cirigliano, D.O.

## 2019-12-10 NOTE — Telephone Encounter (Signed)
[name] is calling from pharmacy and she says they received an order for Clenpiq which I believe she said they don't have.

## 2019-12-10 NOTE — Telephone Encounter (Signed)
We gave her a sample of Clinpiq today

## 2019-12-10 NOTE — Progress Notes (Signed)
Chief Complaint: Abdominal pain, ventral hernia, change in bowel habits  Referring Provider:     Gildardo Pounds, NP   HPI:    [name] is a 59 y.o. female with history of COPD and diabetes, referred to the Gastroenterology Clinic for evaluation of multiple GI symptoms, to include abdominal pain, change in bowel habits, along with evaluation of ventral hernia.  Her main complaint is of a ventral hernia which has been present since ccy. ccy approx 2-3 years ago in [facility].  She additionally c/o "abdominal soreness" along with intermittent nausea without emesis.   Takes Prilosec 20 mg QID (prescribed BID) and Bentyl qid with improvement in reflux sxs and abdominal discomfort.   Separately, alternating bowel habits with diarrhea/constipation. No hematochezia but occasional black stools. No recent Pepto bismol.   Prior hx of hysterectomy.   Early satiety, eating 1/2 of typical meals for the last 6 months. Had lost 30# over the last 3 years, but weight up 7# over last couple months.  No sitophobia.  Was seen by her PCM on 11/10/2019 with c/o abdominal pain, bloating, cramping, nausea after being prescribed Bentyl and omeprazole in 08/2019 (was taking prn).  Did report intermittent diarrhea at that time, but has since worsened.  Reviewed labs from November and October 2020: Normal CBC, CMP (ALP 133; previously normal), Hgb A1c 5.8 (stopped DM meds).  Abdominal x-ray from 10/2018 with moderate stool in the right colon, otherwise normal.  Was previous seen by Dr. Dorrene German at [facility] in 2016/17 for H. pylori.  Initial treatment with pantoprazole, Biaxin, amoxicillin, Pepto-Bismol.  Stool antigen positive.  Second treatment was Levaquin, metronidazole, Pepto-Bismol, omeprazole.  Stool antigen reportedly still positive.  Third treatment with Pylera x10 days. She reports having a negative test following that.  Was also having diarrhea at that time.  Endoscopic history: -EGD  (11/2014, Dr. Dorrene German): Normal, biopsies positive for H. pylori -Colonoscopy (11/2014, Dr. Dorrene German): 3 mm TA, 14 mm pedunculated polyp (TA), mild to moderate left-sided diverticulosis, moderate size internal hemorrhoids.  Normal TI.  Repeat in 5 years (2021)  -CT (2016): Small bowel intussusception -CTE (2016): Normal.  Evaluated by surgery and no intervention recommended   Past Medical History:  Diagnosis Date  . Anxiety   . Arthritis   . COPD (chronic obstructive pulmonary disease) ([place])   . Depression   . Diabetes mellitus without complication ([place])   . Obesity   . Pneumonia   . UTI (urinary tract infection)      Past Surgical History:  Procedure Laterality Date  . ABDOMINAL HYSTERECTOMY     partial  . CHOLECYSTECTOMY    . COLONOSCOPY  2018   Community clinic in [place]   . UPPER GI ENDOSCOPY     Took out 4 polyps. [facility] around 2018   Family History  Problem Relation Age of Onset  . Diabetes Mother   . Lung cancer Father   . Liver disease Father   . Clotting disorder Sister   . Colon polyps Brother        cut some of his lasge instestines out as well   . Clotting disorder Brother   . Colon cancer Maternal Aunt   . Kidney disease Maternal Aunt    Social History   Tobacco Use  . Smoking status: Current Some Day Smoker    Types: Cigarettes  . Smokeless tobacco: Never Used  Substance Use Topics  . Alcohol use: Yes    Comment: occasional   . Drug use: Yes    Types: Marijuana   Current Outpatient Medications  Medication Sig Dispense Refill  . atorvastatin (LIPITOR) 20 MG tablet Take 20 mg by mouth daily.    Marland Kitchen dicyclomine (BENTYL) 20 MG tablet Take 1 tablet (20 mg total) by mouth 4 (four) times daily -  before meals and at bedtime. 120 tablet 3  . diphenhydrAMINE (BENADRYL ALLERGY) 25 mg capsule Take 25 mg by mouth 2 (two) times daily as needed.    . fluticasone (FLONASE) 50 MCG/ACT nasal spray Place 1 spray into both nostrils 2 (two) times  daily as needed for allergies or rhinitis.    . Probiotic Product (PROBIOTIC PO) Take by mouth.    . sucralfate (CARAFATE) 1 g tablet Take 1 g by mouth 4 (four) times daily.    Marland Kitchen albuterol (VENTOLIN HFA) 108 (90 Base) MCG/ACT inhaler Inhale 1-2 puffs into the lungs every 6 (six) hours as needed for wheezing (COUGH). (Patient not taking: Reported on 12/10/2019) 18 g 1  . famotidine (PEPCID) 20 MG tablet Take 1 tablet (20 mg total) by mouth 2 (two) times daily. (Patient not taking: Reported on 12/10/2019) 60 tablet 2  . omeprazole (PRILOSEC) 20 MG capsule Take 1 capsule (20 mg total) by mouth 2 (two) times daily before a meal. (Patient not taking: Reported on 12/10/2019) 60 capsule 3   No current facility-administered medications for this visit.   Allergies  Allergen Reactions  . Gabapentin Other (See Comments)    confusion  . Penicillins   . Sulfa Antibiotics      Review of Systems: All systems reviewed and negative except where noted in HPI.     Physical Exam:    Wt Readings from Last 3 Encounters:  12/10/19 157 lb (71.2 kg)  10/31/18 159 lb 12.8 oz (72.5 kg)  07/21/14 172 lb (78 kg)    BP 118/82   Pulse 85   Temp (!) 97.3 F (36.3 C)   Ht 5\' 2"  (1.575 m)   Wt 157 lb (71.2 kg)   BMI 28.72 kg/m  Constitutional:  Pleasant, in no acute distress. Psychiatric: Normal mood and affect. Behavior is normal. EENT: Pupils normal.  Conjunctivae are normal. No scleral icterus. Neck supple. No cervical LAD. Cardiovascular: Normal rate, regular rhythm. No edema Pulmonary/chest: Effort normal and breath sounds normal. No wheezing, rales or rhonchi. Abdominal: Medium sized ventral hernia.  Minimal TTP in MEG, RUQ, LUQ.  No rebound or guarding.  No peritoneal signs.  Soft, nondistended, nontender. Bowel sounds active throughout. There are no masses palpable. No hepatomegaly. Neurological: Alert and oriented to person place and time. Skin: Skin is warm and dry. No rashes  noted.   ASSESSMENT AND PLAN;   1) Epigastric pain 2) Nausea without emesis 3) History of H. pylori 4) Dark stools  -EGD with gastric biopsies with confirmation of H. pylori eradication -Evaluate for gastritis, PUD, GOO -If EGD largely unrevealing, plan for cross-sectional imaging to rule out extraluminal pathology.  CT and subsequent CTE  5) GERD: -Resume current PPI therapy -Evaluate for erosive esophagitis, LES laxity, hiatal hernia at time of EGD as above  6) Ventral hernia: -Referral to [facility]  7) Change in bowel habits -Colonoscopy with random and directed biopsies -Duodenal biopsies at time of EGD as above  8) History of tubular adenomas: -Last colonoscopy 2016 notable for 2 tubular adenomas, with 1 being >10 mm, with recommendation  at that time for repeat in 2021 -Ongoing polyp surveillance at time of colonoscopy as above  The indications, risks, and benefits of EGD and colonoscopy were explained to the patient in detail. Risks include but are not limited to bleeding, perforation, adverse reaction to medications, and cardiopulmonary compromise. Sequelae include but are not limited to the possibility of surgery, hositalization, and mortality. The patient verbalized understanding and wished to proceed. All questions answered, referred to scheduler and bowel prep ordered. Further recommendations pending results of the exam.   I spent 45 minutes of time, including in depth chart review, independent review of results as outlined above, communicating results with the patient directly, face-to-face time with the patient, coordinating care, ordering studies and medications as appropriate, and documentation.      Lavena Bullion, DO, FACG  12/10/2019, 9:39 AM   Gildardo Pounds, NP

## 2019-12-18 ENCOUNTER — Ambulatory Visit (INDEPENDENT_AMBULATORY_CARE_PROVIDER_SITE_OTHER): Payer: Self-pay

## 2019-12-18 ENCOUNTER — Other Ambulatory Visit: Payer: Self-pay | Admitting: Gastroenterology

## 2019-12-18 DIAGNOSIS — Z1159 Encounter for screening for other viral diseases: Secondary | ICD-10-CM

## 2019-12-19 LAB — SARS CORONAVIRUS 2 (TAT 6-24 HRS): SARS Coronavirus 2: NEGATIVE

## 2019-12-22 ENCOUNTER — Ambulatory Visit (AMBULATORY_SURGERY_CENTER): Payer: Self-pay | Admitting: Gastroenterology

## 2019-12-22 ENCOUNTER — Encounter: Payer: Self-pay | Admitting: Gastroenterology

## 2019-12-22 ENCOUNTER — Telehealth: Payer: Self-pay | Admitting: *Deleted

## 2019-12-22 ENCOUNTER — Other Ambulatory Visit: Payer: Self-pay

## 2019-12-22 VITALS — BP 139/74 | HR 55 | Temp 97.5°F | Resp 17 | Ht 62.0 in | Wt 157.0 lb

## 2019-12-22 DIAGNOSIS — K219 Gastro-esophageal reflux disease without esophagitis: Secondary | ICD-10-CM

## 2019-12-22 DIAGNOSIS — K2950 Unspecified chronic gastritis without bleeding: Secondary | ICD-10-CM

## 2019-12-22 DIAGNOSIS — R197 Diarrhea, unspecified: Secondary | ICD-10-CM

## 2019-12-22 DIAGNOSIS — D120 Benign neoplasm of cecum: Secondary | ICD-10-CM

## 2019-12-22 DIAGNOSIS — K641 Second degree hemorrhoids: Secondary | ICD-10-CM

## 2019-12-22 DIAGNOSIS — D125 Benign neoplasm of sigmoid colon: Secondary | ICD-10-CM

## 2019-12-22 DIAGNOSIS — K2970 Gastritis, unspecified, without bleeding: Secondary | ICD-10-CM

## 2019-12-22 DIAGNOSIS — Z86010 Personal history of colonic polyps: Secondary | ICD-10-CM

## 2019-12-22 DIAGNOSIS — K3189 Other diseases of stomach and duodenum: Secondary | ICD-10-CM

## 2019-12-22 DIAGNOSIS — K2990 Gastroduodenitis, unspecified, without bleeding: Secondary | ICD-10-CM

## 2019-12-22 DIAGNOSIS — D124 Benign neoplasm of descending colon: Secondary | ICD-10-CM

## 2019-12-22 DIAGNOSIS — K5730 Diverticulosis of large intestine without perforation or abscess without bleeding: Secondary | ICD-10-CM

## 2019-12-22 MED ORDER — SODIUM CHLORIDE 0.9 % IV SOLN: 500.0000 mL | Freq: Once | INTRAVENOUS | Status: DC

## 2019-12-22 NOTE — Op Note (Signed)
[name]: [name] Procedure Date: 12/22/2019 10:40 AM MRN: [id number] Endoscopist: Gerrit Heck , MD Age: 59 Referring MD:  Date of Birth: 06/30/1961 Gender: Female Account #: [id number] Procedure:                Colonoscopy Indications:              Surveillance: Personal history of adenomatous                            polyps on last colonoscopy 5 years ago, ,                            Incidental - Generalized abdominal pain, Incidental                            - Diarrhea                           59 yo female with a history of adenomatous polyps                            on last colonoscopy in 2016, including 1 TA >1 cm.                            Due for polyp surveillance. Additionally, she                            presents for evaluation of loose, non-bloody stools                            and generalized abdominal pain. Medicines:                Monitored Anesthesia Care Procedure:                Pre-Anesthesia Assessment:                           - Prior to the procedure, a History and Physical                            was performed, and patient medications and                            allergies were reviewed. The patient's tolerance of                            previous anesthesia was also reviewed. The risks                            and benefits of the procedure and the sedation                            options and risks were discussed with the patient.  All questions were answered, and informed consent                            was obtained. Prior Anticoagulants: The patient has                            taken no previous anticoagulant or antiplatelet                            agents. ASA Grade Assessment: II - A patient with                            mild systemic disease. After reviewing the risks                            and benefits, the patient was deemed in   satisfactory condition to undergo the procedure.                           After obtaining informed consent, the colonoscope                            was passed under direct vision. Throughout the                            procedure, the patient's blood pressure, pulse, and                            oxygen saturations were monitored continuously. The                            Colonoscope was introduced through the anus and                            advanced to the the terminal ileum. The colonoscopy                            was performed without difficulty. The patient                            tolerated the procedure well. The quality of the                            bowel preparation was adequate. The terminal ileum,                            ileocecal valve, appendiceal orifice, and rectum                            were photographed. Scope In: 10:56:20 AM Scope Out: 11:17:46 AM Scope Withdrawal Time: 0 hours 18 minutes 4 seconds  Total Procedure Duration: 0 hours 21 minutes 26 seconds  Findings:                 Hemorrhoids were found  on perianal exam.                           Six sessile polyps were found in the sigmoid colon                            (3), descending colon (2), and cecum (1). The                            polyps were 3 to 6 mm in size. These polyps were                            removed with a cold snare. Resection and retrieval                            were complete. Estimated blood loss was minimal.                           Multiple small and large-mouthed diverticula were                            found in the sigmoid colon.                           A moderate amount of semi-liquid stool was found in                            the sigmoid colon, making visualization difficult.                            Lavage of the area was performed using copious                            amounts of sterile water, resulting in clearance                             with fair visualization. The visualized mucosa in                            this area was otherwise normal appearing.                           The visualized mucosa was otherwise normal                            appearing throughout the remainder of the colon.                            Biopsies for histology were taken with a cold                            forceps from the right colon and left colon for  evaluation of microscopic colitis. Estimated blood                            loss was minimal.                           Non-bleeding internal hemorrhoids were found during                            retroflexion. The hemorrhoids were medium-sized.                           The terminal ileum appeared normal. Complications:            No immediate complications. Estimated Blood Loss:     Estimated blood loss was minimal. Impression:               - Hemorrhoids found on perianal exam.                           - Six 3 to 6 mm polyps in the sigmoid colon, in the                            descending colon and in the cecum, removed with a                            cold snare. Resected and retrieved.                           - Diverticulosis in the sigmoid colon.                           - Stool in the sigmoid colon.                           - Normal mucosa in the entire examined colon.                            Biopsied.                           - Non-bleeding internal hemorrhoids.                           - The examined portion of the ileum was normal. Recommendation:           - Patient has a contact number available for                            emergencies. The signs and symptoms of potential                            delayed complications were discussed with the                            patient. Return to normal activities tomorrow.  Written discharge instructions were provided to the                             patient.                           - Resume previous diet.                           - Continue present medications.                           - Await pathology results.                           - Repeat colonoscopy in 2 years for surveillance                            given presence of multiple (>3) polyps and reduced                            visualization in sigmoid colon during today's study.                           - Use fiber, for example Citrucel, Fibercon, Konsyl                            or Metamucil.                           - Return to GI clinic PRN. Gerrit Heck, MD 12/22/2019 11:31:08 AM

## 2019-12-22 NOTE — Telephone Encounter (Signed)
Please place referral to [facility] for evaluation of ventral hernia. Thanks.

## 2019-12-22 NOTE — Progress Notes (Signed)
Called to room to assist during endoscopic procedure.  Patient ID and intended procedure confirmed with present staff. Received instructions for my participation in the procedure from the performing physician.  

## 2019-12-22 NOTE — Telephone Encounter (Signed)
Referral to [facility] for Ventral hernia was sent on 12/10/2019. I spoke with a surgery scheduler today who stated she is scheduled for a consult on 01/04/2020 at 10 am with Dr Brantley Stage. Patient has been notified of this upcoming appointment. All questions answered,patient voiced understanding.

## 2019-12-22 NOTE — Patient Instructions (Signed)
Handout provided on polyps, gastritis, hemorrhoids, and diverticulosis.   Use fiber, for example Citrucel, Fibercon, Konsyl or Metamucil.- these are all over the counter medications.    YOU HAD AN ENDOSCOPIC PROCEDURE TODAY AT [facility]:   Refer to the procedure report that was given to you for any specific questions about what was found during the examination.  If the procedure report does not answer your questions, please call your gastroenterologist to clarify.  If you requested that your care partner not be given the details of your procedure findings, then the procedure report has been included in a sealed envelope for you to review at your convenience later.  YOU SHOULD EXPECT: Some feelings of bloating in the abdomen. Passage of more gas than usual.  Walking can help get rid of the air that was put into your GI tract during the procedure and reduce the bloating. If you had a lower endoscopy (such as a colonoscopy or flexible sigmoidoscopy) you may notice spotting of blood in your stool or on the toilet paper. If you underwent a bowel prep for your procedure, you may not have a normal bowel movement for a few days.  Please Note:  You might notice some irritation and congestion in your nose or some drainage.  This is from the oxygen used during your procedure.  There is no need for concern and it should clear up in a day or so.  SYMPTOMS TO REPORT IMMEDIATELY:   Following lower endoscopy (colonoscopy or flexible sigmoidoscopy):  Excessive amounts of blood in the stool  Significant tenderness or worsening of abdominal pains  Swelling of the abdomen that is new, acute  Fever of 100F or higher   Following upper endoscopy (EGD)  Vomiting of blood or coffee ground material  New chest pain or pain under the shoulder blades  Painful or persistently difficult swallowing  New shortness of breath  Fever of 100F or higher  Black, tarry-looking stools  For urgent or emergent  issues, a gastroenterologist can be reached at any hour by calling [phone].   DIET:  We do recommend a small meal at first, but then you may proceed to your regular diet.  Drink plenty of fluids but you should avoid alcoholic beverages for 24 hours.  ACTIVITY:  You should plan to take it easy for the rest of today and you should NOT DRIVE or use heavy machinery until tomorrow (because of the sedation medicines used during the test).    FOLLOW UP: Our staff will call the number listed on your records 48-72 hours following your procedure to check on you and address any questions or concerns that you may have regarding the information given to you following your procedure. If we do not reach you, we will leave a message.  We will attempt to reach you two times.  During this call, we will ask if you have developed any symptoms of COVID 19. If you develop any symptoms (ie: fever, flu-like symptoms, shortness of breath, cough etc.) before then, please call [phone].  If you test positive for Covid 19 in the 2 weeks post procedure, please call and report this information to [place].    If any biopsies were taken you will be contacted by phone or by letter within the next 1-3 weeks.  Please call us at [phone] if you have not heard about the biopsies in 3 weeks.    SIGNATURES/CONFIDENTIALITY: You and/or your care partner have signed paperwork which will  be entered into your electronic medical record.  These signatures attest to the fact that that the information above on your After Visit Summary has been reviewed and is understood.  Full responsibility of the confidentiality of this discharge information lies with you and/or your care-partner.

## 2019-12-22 NOTE — Op Note (Signed)
[place] Patient Name: [name] Procedure Date: 12/22/2019 10:40 AM MRN: [id number] Endoscopist: Gerrit Heck , MD Age: 59 Referring MD:  Date of Birth: Apr 12, 1961 Gender: Female Account #: [id number] Procedure:                Upper GI endoscopy Indications:              Generalized abdominal pain, Suspected esophageal                            reflux, Exclusion of Helicobacter pylori, Diarrhea,                            Early satiety                           History of H pylori in 2016/2017, requiring 3                            course of antibiotics, with eradication noted on                            subsequent testing. Reflux symptoms responsive to                            omeprazole. Medicines:                Monitored Anesthesia Care Procedure:                Pre-Anesthesia Assessment:                           - Prior to the procedure, a History and Physical                            was performed, and patient medications and                            allergies were reviewed. The patient's tolerance of                            previous anesthesia was also reviewed. The risks                            and benefits of the procedure and the sedation                            options and risks were discussed with the patient.                            All questions were answered, and informed consent                            was obtained. Prior Anticoagulants: The patient has  taken no previous anticoagulant or antiplatelet                            agents. ASA Grade Assessment: II - A patient with                            mild systemic disease. After reviewing the risks                            and benefits, the patient was deemed in                            satisfactory condition to undergo the procedure.                           After obtaining informed consent, the endoscope was                             passed under direct vision. Throughout the                            procedure, the patient's blood pressure, pulse, and                            oxygen saturations were monitored continuously. The                            Endoscope was introduced through the mouth, and                            advanced to the second part of duodenum. The upper                            GI endoscopy was accomplished without difficulty.                            The patient tolerated the procedure well. Scope In: Scope Out: Findings:                 The examined esophagus was normal.                           Scattered mild inflammation characterized by                            erythema was found in the gastric body, at the                            incisura and in the gastric antrum. Biopsies were                            taken with a cold forceps for Helicobacter pylori  testing. Estimated blood loss was minimal.                           The duodenal bulb, first portion of the duodenum                            and second portion of the duodenum were normal.                            Biopsies for histology were taken with a cold                            forceps for evaluation of celiac disease. Estimated                            blood loss was minimal. Complications:            No immediate complications. Estimated Blood Loss:     Estimated blood loss was minimal. Impression:               - Normal esophagus.                           - Mild, non-ulcer gastritis. Biopsied.                           - Normal duodenal bulb, first portion of the                            duodenum and second portion of the duodenum.                            Biopsied. Recommendation:           - Patient has a contact number available for                            emergencies. The signs and symptoms of potential                            delayed complications were discussed  with the                            patient. Return to normal activities tomorrow.                            Written discharge instructions were provided to the                            patient.                           - Resume previous diet.                           - Continue present medications.                           -  Await pathology results.                           - Perform a colonoscopy today. Gerrit Heck, MD 12/22/2019 11:23:00 AM

## 2019-12-22 NOTE — Progress Notes (Signed)
To PACU, VSS. Report to [email or web address] 

## 2019-12-22 NOTE — Progress Notes (Signed)
Pt's states no medical or surgical changes since previsit or office visit. 

## 2019-12-24 ENCOUNTER — Telehealth: Payer: Self-pay

## 2019-12-24 NOTE — Telephone Encounter (Signed)
  Follow up Call-  Call back number 12/22/2019  Post procedure Call Back phone  # [phone]  Permission to leave phone message Yes  Some recent data might be hidden     Patient questions:  Do you have a fever, pain , or abdominal swelling? No. Pain Score  0 *  Have you tolerated food without any problems? Yes.    Have you been able to return to your normal activities? Yes.    Do you have any questions about your discharge instructions: Diet   No. Medications  No. Follow up visit  No.  Do you have questions or concerns about your Care? No.  Actions: * If pain score is 4 or above: No action needed, pain <4.  1. Have you developed a fever since your procedure? no  2.   Have you had an respiratory symptoms (SOB or cough) since your procedure? no  3.   Have you tested positive for COVID 19 since your procedure no  4.   Have you had any family members/close contacts diagnosed with the COVID 19 since your procedure?  no   If yes to any of these questions please route to Joylene John, RN and Alphonsa Gin, Therapist, sports.

## 2020-01-02 ENCOUNTER — Encounter: Payer: Self-pay | Admitting: Gastroenterology

## 2020-01-04 ENCOUNTER — Telehealth: Payer: Self-pay | Admitting: Nurse Practitioner

## 2020-01-04 NOTE — Telephone Encounter (Signed)
I was unable to talk to patient but  I don't see a referral for General surgery  and [facility] is not part of [facility] financial patient will need to go to [place] .

## 2020-01-04 NOTE — Telephone Encounter (Signed)
[name],  Are we able to resend this to another office accepting the patients insurance?

## 2020-01-04 NOTE — Telephone Encounter (Signed)
Patient called and stated that she attended her appointment in the past with the gastroenterologist, and they referred her to a surgical office, [facility]. She attempted to attend the appointment today but was denied due to her insurance she stated she called her gastro office and they told her to contact her pcp. So the patient needs an appointment to a general surgery office for her hernias. Patient requested for the office to be in [place] if possible. Please follow up at your earliest convenience.

## 2020-01-13 ENCOUNTER — Ambulatory Visit: Payer: Self-pay | Attending: Nurse Practitioner | Admitting: Nurse Practitioner

## 2020-01-13 ENCOUNTER — Encounter: Payer: Self-pay | Admitting: Nurse Practitioner

## 2020-01-13 ENCOUNTER — Other Ambulatory Visit: Payer: Self-pay

## 2020-01-13 DIAGNOSIS — K439 Ventral hernia without obstruction or gangrene: Secondary | ICD-10-CM

## 2020-01-13 DIAGNOSIS — F17200 Nicotine dependence, unspecified, uncomplicated: Secondary | ICD-10-CM

## 2020-01-13 DIAGNOSIS — Z13220 Encounter for screening for lipoid disorders: Secondary | ICD-10-CM

## 2020-01-13 MED ORDER — ALBUTEROL SULFATE HFA 108 (90 BASE) MCG/ACT IN AERS: 1.0000 | INHALATION_SPRAY | Freq: Four times a day (QID) | RESPIRATORY_TRACT | 1 refills | Status: AC | PRN

## 2020-01-13 MED ORDER — ATORVASTATIN CALCIUM 20 MG PO TABS: 20.0000 mg | ORAL_TABLET | Freq: Every day | ORAL | 1 refills | Status: DC

## 2020-01-13 MED FILL — ALBUTEROL SULFATE HFA 108 (: 108 (90 BAS | 25 days supply | Qty: 18 | Fill #0

## 2020-01-13 MED FILL — ATORVASTATIN CALCIUM 20 MG: 20 | 30 days supply | Qty: 30 | Fill #0

## 2020-01-13 NOTE — Progress Notes (Signed)
Virtual Visit via Telephone Note Due to national recommendations of social distancing due to [place] 19, telehealth visit is felt to be most appropriate for this patient at this time.  I discussed the limitations, risks, security and privacy concerns of performing an evaluation and management service by telephone and the availability of in person appointments. I also discussed with the patient that there may be a patient responsible charge related to this service. The patient expressed understanding and agreed to proceed.    I connected with [name] on 01/13/20  at   1:30 PM EST  EDT by telephone and verified that I am speaking with the correct person using two identifiers.   Consent I discussed the limitations, risks, security and privacy concerns of performing an evaluation and management service by telephone and the availability of in person appointments. I also discussed with the patient that there may be a patient responsible charge related to this service. The patient expressed understanding and agreed to proceed.   Location of Patient: Private Residence    Location of Provider: [place] and [place] participating in Telemedicine visit: Geryl Rankins FNP-BC [place]    History of Present Illness: Telemedicine visit for: Referral  Requesting referral for general surgery for evaluation of ventral hernia. She is currently doing well. Reports appetite has improved but controlled, there is less bloating and stools are more frequent with taking metamucil 4-n-1. She also has more energy.   Will place referral for ventral hernia to General Surgery    Past Medical History:  Diagnosis Date  . Anxiety   . Arthritis   . COPD (chronic obstructive pulmonary disease) ([place])   . Depression   . Diabetes mellitus without complication ([place])   . Obesity   . Pneumonia   . UTI (urinary tract infection)     Past Surgical History:   Procedure Laterality Date  . ABDOMINAL HYSTERECTOMY     partial  . CHOLECYSTECTOMY    . COLONOSCOPY  2018   Community clinic in [place]   . UPPER GI ENDOSCOPY     Took out 4 polyps. [facility] around 2018    Family History  Problem Relation Age of Onset  . Diabetes Mother   . Lung cancer Father   . Liver disease Father   . Clotting disorder Sister   . Colon polyps Brother        cut some of his lasge instestines out as well   . Clotting disorder Brother   . Colon cancer Maternal Aunt   . Kidney disease Maternal Aunt     Social History   Socioeconomic History  . Marital status: Divorced    Spouse name: Not on file  . Number of children: Not on file  . Years of education: Not on file  . Highest education level: Not on file  Occupational History  . Not on file  Tobacco Use  . Smoking status: Current Some Day Smoker    Types: Cigarettes  . Smokeless tobacco: Never Used  Substance and Sexual Activity  . Alcohol use: Yes    Comment: occasional   . Drug use: Yes    Types: Marijuana  . Sexual activity: Not on file  Other Topics Concern  . Not on file  Social History Narrative  . Not on file   Social Determinants of Health   Financial Resource Strain:   . Difficulty of Paying Living  Expenses: Not on file  Food Insecurity:   . Worried About Charity fundraiser in the Last Year: Not on file  . Ran Out of Food in the Last Year: Not on file  Transportation Needs:   . Lack of Transportation (Medical): Not on file  . Lack of Transportation (Non-Medical): Not on file  Physical Activity:   . Days of Exercise per Week: Not on file  . Minutes of Exercise per Session: Not on file  Stress:   . Feeling of Stress : Not on file  Social Connections:   . Frequency of Communication with Friends and Family: Not on file  . Frequency of Social Gatherings with Friends and Family: Not on file  . Attends Religious Services: Not on file  . Active Member of Clubs or  Organizations: Not on file  . Attends Archivist Meetings: Not on file  . Marital Status: Not on file     Observations/Objective: Awake, alert and oriented x 3   ROS  Assessment and Plan: [name] was seen today for referral.  Diagnoses and all orders for this visit:  Ventral hernia without obstruction or gangrene -     Ambulatory referral to General Surgery  Tobacco dependence -     albuterol (VENTOLIN HFA) 108 (90 Base) MCG/ACT inhaler; Inhale 1-2 puffs into the lungs every 6 (six) hours as needed for wheezing (COUGH). [name] was counseled on the dangers of tobacco use, and was advised to quit. Reviewed strategies to maximize success, including removing cigarettes and smoking materials from environment, stress management and support of family/friends as well as pharmacological alternatives including: Wellbutrin, Chantix, Nicotine patch, Nicotine gum or lozenges. Smoking cessation support: smoking cessation hotline: 1-800-QUIT-NOW.  Smoking cessation classes are also available through [facility] [facility]. Call [phone] or visit our website at [email or web address]   A total of 2 minutes was spent on counseling for smoking cessation and [name] is not ready to quit.   Lipid screening -     atorvastatin (LIPITOR) 20 MG tablet; Take 1 tablet (20 mg total) by mouth daily.     Follow Up Instructions Return in about 8 weeks (around 03/09/2020).     I discussed the assessment and treatment plan with the patient. The patient was provided an opportunity to ask questions and all were answered. The patient agreed with the plan and demonstrated an understanding of the instructions.   The patient was advised to call back or seek an in-person evaluation if the symptoms worsen or if the condition fails to improve as anticipated.  I provided 16 minutes of non-face-to-face time during this encounter including median intraservice time, reviewing previous notes, labs,  imaging, medications and explaining diagnosis and management.  Gildardo Pounds, FNP-BC

## 2020-01-26 ENCOUNTER — Ambulatory Visit: Payer: Self-pay | Admitting: General Surgery

## 2020-02-16 ENCOUNTER — Other Ambulatory Visit: Payer: Self-pay

## 2020-02-16 ENCOUNTER — Encounter: Payer: Self-pay | Admitting: General Surgery

## 2020-02-16 ENCOUNTER — Ambulatory Visit (INDEPENDENT_AMBULATORY_CARE_PROVIDER_SITE_OTHER): Payer: Self-pay | Admitting: General Surgery

## 2020-02-16 ENCOUNTER — Ambulatory Visit (INDEPENDENT_AMBULATORY_CARE_PROVIDER_SITE_OTHER): Payer: Self-pay

## 2020-02-16 VITALS — BP 138/83 | HR 74 | Temp 97.9°F | Ht 62.0 in | Wt 158.0 lb

## 2020-02-16 DIAGNOSIS — E049 Nontoxic goiter, unspecified: Secondary | ICD-10-CM

## 2020-02-16 DIAGNOSIS — E041 Nontoxic single thyroid nodule: Secondary | ICD-10-CM

## 2020-02-16 NOTE — Progress Notes (Signed)
Patient ID: [name], female   DOB: 01-Jun-1961, 59 y.o.   MRN: [id number]  Chief Complaint  Patient presents with  . New Patient (Initial Visit)    Ventral Hernia    HPI [name] is a 59 y.o. female.  She has been referred by her primary care provider, Abigail Rankins, NP, for evaluation of a ventral hernia.  [name] states that she has noticed a bulge in her upper abdomen for about 2 to 3 years.  It is worse with lifting and increased activity.  The discomfort is localized to the area of the bulge.  She has had some occasional nausea, but attributes this to some of her recent gastric issues.  She denies any obstipation constipation or other symptoms of obstruction.  The discomfort she is experiencing from the hernia has been interfering with her activities of daily living.  She is interested in surgical repair.   Past Medical History:  Diagnosis Date  . Anxiety   . Arthritis   . COPD (chronic obstructive pulmonary disease) ([place])   . Depression   . Diabetes mellitus without complication ([place])   . Obesity   . Pneumonia   . UTI (urinary tract infection)     Past Surgical History:  Procedure Laterality Date  . ABDOMINAL HYSTERECTOMY     partial  . CHOLECYSTECTOMY    . COLONOSCOPY  2018, 12/22/19   Community clinic in [place]   . UPPER GI ENDOSCOPY  2018, 12/22/19   Took out 4 polyps. [facility] around 2018    Family History  Problem Relation Age of Onset  . Diabetes Mother   . Lung cancer Father   . Liver disease Father   . Clotting disorder Sister   . Colon polyps Brother        cut some of his large instestine out as well   . Clotting disorder Brother   . Lung cancer Brother   . Colon cancer Maternal Aunt   . Kidney disease Maternal Aunt     Social History Social History   Tobacco Use  . Smoking status: Current Some Day Smoker    Packs/day: 0.50    Types: Cigarettes  . Smokeless tobacco: Never Used  Substance Use Topics  . Alcohol use:  Yes    Comment: occasional   . Drug use: Yes    Types: Marijuana    Allergies  Allergen Reactions  . Gabapentin Other (See Comments)    confusion  . Penicillins   . Sulfa Antibiotics     Current Outpatient Medications  Medication Sig Dispense Refill  . albuterol (VENTOLIN HFA) 108 (90 Base) MCG/ACT inhaler Inhale 1-2 puffs into the lungs every 6 (six) hours as needed for wheezing (COUGH). 18 g 1  . diphenhydrAMINE (BENADRYL ALLERGY) 25 mg capsule Take 25 mg by mouth 2 (two) times daily as needed.    . fluticasone (FLONASE) 50 MCG/ACT nasal spray Place 1 spray into both nostrils 2 (two) times daily as needed for allergies or rhinitis.    Marland Kitchen omeprazole (PRILOSEC) 20 MG capsule Take 20 mg by mouth daily.    . Psyllium (METAMUCIL PO) Take by mouth.    . dicyclomine (BENTYL) 20 MG tablet Take 1 tablet (20 mg total) by mouth 4 (four) times daily -  before meals and at bedtime. 120 tablet 3   No current facility-administered medications for this visit.    Review of Systems Review of Systems  Neurological: Positive for  numbness.       She reports numbness in her arms and legs.  This has not been evaluated by her primary care provider, according to the patient.  All other systems reviewed and are negative. Or as discussed in the history of present illness   Today's Vitals   02/16/20 1000  BP: 138/83  Pulse: 74  Temp: 97.9 F (36.6 C)  SpO2: 97%  Weight: 158 lb (71.7 kg)  Height: 5\' 2"  (1.575 m)   Body mass index is 28.9 kg/m.  Physical Exam Physical Exam Constitutional:      General: She is not in acute distress.    Appearance: Normal appearance. She is obese.  HENT:     Head: Normocephalic and atraumatic.     Nose:     Comments: Covered with a mask secondary to COVID-19 precautions    Mouth/Throat:     Comments: Covered with a mask secondary to COVID-19 precautions Eyes:     General: No scleral icterus.       Right eye: No discharge.        Left eye: No discharge.   Neck:     Comments: The thyroid is enlarged and has an irregular contour.  The gland moves freely with deglutition. Cardiovascular:     Rate and Rhythm: Normal rate and regular rhythm.     Comments: Pedal pulses are diminished bilaterally. Pulmonary:     Effort: Pulmonary effort is normal. No respiratory distress.     Breath sounds: Normal breath sounds.  Abdominal:     General: Abdomen is flat.     Palpations: Abdomen is soft.     Comments: There is a definite bulge associated with a subxiphoid trocar site from her prior laparoscopic cholecystectomy.  I am unable to palpate the fascial edges on exam.  It is most noticeable with Valsalva.  Genitourinary:    Comments: Deferred Musculoskeletal:        General: No swelling or tenderness.     Cervical back: Normal range of motion.  Lymphadenopathy:     Cervical: No cervical adenopathy.  Skin:    General: Skin is warm and dry.  Neurological:     General: No focal deficit present.     Mental Status: She is alert and oriented to person, place, and time.  Psychiatric:        Mood and Affect: Mood normal.        Behavior: Behavior normal.     Data Reviewed On review of the electronic medical record, she has actually had more recent endoscopic evaluations, done at [facility].  She had both a colonoscopy and upper endoscopy on December 22, 2019.  Hemorrhoids and polyps were found on her colonoscopy.  Biopsies were also taken to look for microscopic colitis.  The polyps were negative for malignancy and there was no microscopic colitis seen.  On her upper endoscopy, there appeared to be some reactive gastropathy and H. pylori testing was negative.  In January 2020, she had a CT scan of the abdomen and pelvis performed for abdominal pain.  This was done at [facility].  She had diverticulosis without diverticulitis.  There were also concerns for avascular necrosis of the right femoral head. She has actually  had multiple CT scans performed in the [facility] system, all of which appear to have been performed for abdominal pain.  On a scan performed June 06, 2019, a small right paramedian upper abdominal ventral hernia and a small  midline infraumbilical pelvic ventral hernia were described.  Both contain fat without any inflammation in the herniated fat.  These images are not available for me to review.   Assessment This is a 59 year old woman who has 2 ventral hernias.  The infraumbilical hernia is not palpable on exam and is asymptomatic.  The other hernia, associated with a prior laparoscopic trocar site, is causing her discomfort and she is interested in surgical repair.  On examination today, an abnormal thyroid gland was palpated.  I performed a bedside ultrasound of the thyroid and identified an enlarged, multinodular goiter.  The majority of the nodules were cystic.  There is an ill-defined nodule just to the left of the isthmus, as well as a larger nodule with in the left lobe parenchyma.  It is taller than wide and profoundly hypoechoic.  It has somewhat irregular margins.  It does not have increased intranodular vascular flow.  I recommended that she undergo thyroid biopsy.  Plan I performed a fine-needle aspiration biopsy in clinic of the suspicious nodule.  Slides were sent for evaluation and ThyroSeq was collected.  I will contact her with the results of her biopsy.  In the meantime, we are requesting the CT images from [facility] to better evaluate and plan for surgical intervention on her hernias.  Once all the data are collected, I will contact the patient we will discuss the plan going forward.   Fredirick Maudlin 02/16/2020, 12:08 PM

## 2020-02-16 NOTE — Patient Instructions (Addendum)
We will request your CT images from [place] to better see your hernia.  Once we get the CT images and Dr Celine Ahr can review these we will get you set up for surgery. Our surgery scheduler will contact you to look at surgery dates and go over surgery information, please have your [facility] Surgery sheet ready when she calls you.  We have done a Thyroid Ultrasound for you today. You have a Thyroid nodule on the left that a biopsy is recommended.  We have done a biopsy of your thyroid today. You will want to use the ice pack to your neck several times today. Just place it back in the freezer You may use Ibuprofen or Tylenol as needed for comfort. You may also need to sleep with your head elevated for tonight.  We will call you with your results.    Hernia, Adult       A hernia happens when tissue inside your body pushes out through a weak spot in your belly muscles (abdominal wall). This makes a round lump (bulge). The lump may be:  In a scar from surgery that was done in your belly (incisional hernia).  Near your belly button (umbilical hernia).  In your groin (inguinal hernia). Your groin is the area where your leg meets your lower belly (abdomen). This kind of hernia could also be: ? In your scrotum, if you are female. ? In folds of skin around your vagina, if you are female.  In your upper thigh (femoral hernia).  Inside your belly (hiatal hernia). This happens when your stomach slides above the muscle between your belly and your chest (diaphragm). If your hernia is small and it does not cause pain, you may not need treatment. If your hernia is large or it causes pain, you may need surgery. Follow these instructions at home: Activity  Avoid stretching or overusing (straining) the muscles near your hernia. Straining can happen when you: ? Lift something heavy. ? Poop (have a bowel movement).  Do not lift anything that is heavier than 10 lb (4.5 kg), or the limit that you are told,  until your doctor says that it is safe.  Use the strength of your legs when you lift something heavy. Do not use only your back muscles to lift. General instructions  Do these things if told by your doctor so you do not have trouble pooping (constipation): ? Drink enough fluid to keep your pee (urine) pale yellow. ? Eat foods that are high in fiber. These include fresh fruits and vegetables, whole grains, and beans. ? Limit foods that are high in fat and processed sugars. These include foods that are fried or sweet. ? Take medicine for trouble pooping.  When you cough, try to cough gently.  You may try to push your hernia in by very gently pressing on it when you are lying down. Do not try to force the bulge back in if it will not push in easily.  If you are overweight, work with your doctor to lose weight safely.  Do not use any products that have nicotine or tobacco in them. These include cigarettes and e-cigarettes. If you need help quitting, ask your doctor.  If you will be having surgery (hernia repair), watch your hernia for changes in shape, size, or color. Tell your doctor if you see any changes.  Take over-the-counter and prescription medicines only as told by your doctor.  Keep all follow-up visits as told by your doctor. Contact  a doctor if:  You get new pain, swelling, or redness near your hernia.  You poop fewer times in a week than normal.  You have trouble pooping.  You have poop (stool) that is more dry than normal.  You have poop that is harder or larger than normal. Get help right away if:  You have a fever.  You have belly pain that gets worse.  You feel sick to your stomach (nauseous).  You throw up (vomit).  Your hernia cannot be pushed in by very gently pressing on it when you are lying down. Do not try to force the bulge back in if it will not push in easily.  Your hernia: ? Changes in shape or size. ? Changes color. ? Feels hard or it hurts  when you touch it. These symptoms may represent a serious problem that is an emergency. Do not wait to see if the symptoms will go away. Get medical help right away. Call your local emergency services (911 in the [place] Summary  A hernia happens when tissue inside your body pushes out through a weak spot in the belly muscles. This creates a bulge.  If your hernia is small and it does not hurt, you may not need treatment. If your hernia is large or it hurts, you may need surgery.  If you will be having surgery, watch your hernia for changes in shape, size, or color. Tell your doctor about any changes. This information is not intended to replace advice given to you by your health care provider. Make sure you discuss any questions you have with your health care provider. Document Revised: 02/19/2019 Document Reviewed: 07/31/2017 Elsevier Patient Education  [place].

## 2020-02-26 ENCOUNTER — Telehealth: Payer: Self-pay | Admitting: *Deleted

## 2020-02-26 NOTE — Telephone Encounter (Signed)
Patient notified of results.

## 2020-02-26 NOTE — Telephone Encounter (Signed)
Message has been sent to Dr Celine Ahr regarding this.

## 2020-02-26 NOTE — Telephone Encounter (Signed)
Patient called and wants to know the results from her biopsy that was done on 02/16/20. Please call and advise

## 2020-03-01 ENCOUNTER — Other Ambulatory Visit: Payer: Self-pay | Admitting: General Surgery

## 2020-03-01 ENCOUNTER — Ambulatory Visit
Admission: RE | Admit: 2020-03-01 | Discharge: 2020-03-01 | Disposition: A | Discharge status: Home / Self Care | Payer: Self-pay | Source: Ambulatory Visit | Attending: General Surgery | Admitting: General Surgery

## 2020-03-01 DIAGNOSIS — K439 Ventral hernia without obstruction or gangrene: Secondary | ICD-10-CM

## 2020-03-02 ENCOUNTER — Telehealth: Payer: Self-pay | Admitting: *Deleted

## 2020-03-02 NOTE — Telephone Encounter (Signed)
Patient called and wanted to see about getting surgery scheduled, Pamala Hurry didn't have a surgery sheet filled out for her. She sees Dr Celine Ahr

## 2020-03-03 ENCOUNTER — Telehealth: Payer: Self-pay | Admitting: General Surgery

## 2020-03-03 NOTE — Telephone Encounter (Signed)
Spoke to Dr Celine Ahr and surgery sheet filled out.   Called pt and made her aware our Surgery scheduler will contact her as soon as they have a surgery date. Pt voiced understanding and has no further concerns.

## 2020-03-03 NOTE — Telephone Encounter (Signed)
Outgoing call, left message. Please advise patient of  Pre-Admission date/time, COVID Testing date and Surgery date.  Surgery Date: 03/18/20 Preadmission Testing Date: 03/10/20 (phone 8a-1p) Covid Testing Date: 03/16/20 - patient advised to go to the [place] ([place]) between 8a-1p  Also patient is to call [phone], between 1-3:00pm the day before surgery, to find out what time to arrive for surgery.

## 2020-03-04 NOTE — Telephone Encounter (Signed)
Outgoing call is made again, patient is now informed of her dates regarding surgery and voices understanding.

## 2020-03-10 ENCOUNTER — Other Ambulatory Visit: Payer: Self-pay | Admitting: General Surgery

## 2020-03-10 ENCOUNTER — Other Ambulatory Visit: Payer: Self-pay

## 2020-03-10 ENCOUNTER — Encounter
Admission: RE | Admit: 2020-03-10 | Discharge: 2020-03-10 | Disposition: A | Discharge status: Home / Self Care | Payer: Self-pay | Source: Ambulatory Visit | Attending: General Surgery | Admitting: General Surgery

## 2020-03-10 DIAGNOSIS — K439 Ventral hernia without obstruction or gangrene: Secondary | ICD-10-CM

## 2020-03-10 HISTORY — DX: Personal history of other diseases of the digestive system: Z87.19

## 2020-03-10 HISTORY — DX: Gastro-esophageal reflux disease without esophagitis: K21.9

## 2020-03-10 NOTE — Patient Instructions (Addendum)
Your procedure is scheduled on: 03/18/20 Report to [facility]. To find out your arrival time please call [phone] between 1PM - 3PM on 03/17/20.  Remember: Instructions that are not followed completely may result in serious medical risk, up to and including death, or upon the discretion of your surgeon and anesthesiologist your surgery may need to be rescheduled.     _X__ 1. Do not eat food after midnight the night before your procedure.                 No gum chewing or hard candies. You may drink clear liquids up to 2 hours                 before you are scheduled to arrive for your surgery- DO not drink clear                 liquids within 2 hours of the start of your surgery.                 Clear Liquids include:  water, apple juice without pulp, clear carbohydrate                 drink such as Clearfast or Gatorade, Black Coffee or Tea (Do not add                 anything to coffee or tea). Diabetics water only  __X__2.  On the morning of surgery brush your teeth with toothpaste and water, you                 may rinse your mouth with mouthwash if you wish.  Do not swallow any              toothpaste of mouthwash.     _X__ 3.  No Alcohol for 24 hours before or after surgery.   _X__ 4.  Do Not Smoke or use e-cigarettes For 24 Hours Prior to Your Surgery.                 Do not use any chewable tobacco products for at least 6 hours prior to                 surgery.  ____  5.  Bring all medications with you on the day of surgery if instructed.   __X__  6.  Notify your doctor if there is any change in your medical condition      (cold, fever, infections).     Do not wear jewelry, make-up, hairpins, clips or nail polish. Do not wear lotions, powders, or perfumes.  Do not shave 48 hours prior to surgery. Men may shave face and neck. Do not bring valuables to the hospital.    [facility] is not responsible for any belongings or  valuables.  Contacts, dentures/partials or body piercings may not be worn into surgery. Bring a case for your contacts, glasses or hearing aids, a denture cup will be supplied. Leave your suitcase in the car. After surgery it may be brought to your room. For patients admitted to the hospital, discharge time is determined by your treatment team.   Patients discharged the day of surgery will not be allowed to drive home.   Please read over the following fact sheets that you were given:   MRSA Information  __X__ Take these medicines the morning of surgery with A SIP OF WATER:  1. none  2.   3.   4.  5.  6.  ____ Fleet Enema (as directed)   __X__ Use CHG Soap/SAGE wipes as directed  __X__ Use inhalers on the day of surgery  ____ Stop metformin/Janumet/Farxiga 2 days prior to surgery    ____ Take 1/2 of usual insulin dose the night before surgery. No insulin the morning          of surgery.   ____ Stop Blood Thinners Coumadin/Plavix/Xarelto/Pleta/Pradaxa/Eliquis/Effient/Aspirin  on  Or contact your Surgeon, Cardiologist or Medical Doctor regarding  ability to stop your blood thinners  __X__ Stop Anti-inflammatories 7 days before surgery such as Advil, Ibuprofen, Motrin,  BC or Goodies Powder, Naprosyn, Naproxen, Aleve, Aspirin    __X__ Stop all herbal supplements, fish oil or vitamin E until after surgery.    ____ Bring C-Pap to the hospital.

## 2020-03-14 ENCOUNTER — Ambulatory Visit: Payer: Self-pay | Admitting: Nurse Practitioner

## 2020-03-16 ENCOUNTER — Other Ambulatory Visit: Payer: Self-pay

## 2020-03-16 ENCOUNTER — Encounter
Admission: RE | Admit: 2020-03-16 | Discharge: 2020-03-16 | Disposition: A | Discharge status: Home / Self Care | Payer: Self-pay | Source: Ambulatory Visit | Attending: General Surgery | Admitting: General Surgery

## 2020-03-16 DIAGNOSIS — Z20822 Contact with and (suspected) exposure to covid-19: Secondary | ICD-10-CM | POA: Insufficient documentation

## 2020-03-16 DIAGNOSIS — E118 Type 2 diabetes mellitus with unspecified complications: Secondary | ICD-10-CM | POA: Insufficient documentation

## 2020-03-16 DIAGNOSIS — Z01818 Encounter for other preprocedural examination: Secondary | ICD-10-CM | POA: Insufficient documentation

## 2020-03-16 LAB — CBC
HCT: 40.2 % (ref 36.0–46.0)
Hemoglobin: 13.9 g/dL (ref 12.0–15.0)
MCH: 27.6 pg (ref 26.0–34.0)
MCHC: 34.6 g/dL (ref 30.0–36.0)
MCV: 79.9 fL — ABNORMAL LOW (ref 80.0–100.0)
Platelets: 167 10*3/uL (ref 150–400)
RBC: 5.03 MIL/uL (ref 3.87–5.11)
RDW: 14.4 % (ref 11.5–15.5)
WBC: 6.4 10*3/uL (ref 4.0–10.5)
nRBC: 0 % (ref 0.0–0.2)

## 2020-03-16 LAB — BASIC METABOLIC PANEL
Anion gap: 6 (ref 5–15)
BUN: 11 mg/dL (ref 6–20)
CO2: 26 mmol/L (ref 22–32)
Calcium: 9 mg/dL (ref 8.9–10.3)
Chloride: 106 mmol/L (ref 98–111)
Creatinine, Ser: 0.71 mg/dL (ref 0.44–1.00)
GFR calc Af Amer: >60 mL/min (ref >60)
GFR calc non Af Amer: >60 mL/min (ref >60)
Glucose, Bld: 98 mg/dL (ref 70–99)
Potassium: 3.8 mmol/L (ref 3.5–5.1)
Sodium: 138 mmol/L (ref 135–145)

## 2020-03-16 LAB — SARS CORONAVIRUS 2 (TAT 6-24 HRS): SARS Coronavirus 2: NEGATIVE

## 2020-03-17 MED ORDER — CEFAZOLIN SODIUM-DEXTROSE 2-4 GM/100ML-% IV SOLN
2.0000 g | INTRAVENOUS | Status: AC
  Administered 2020-03-18: 2 g via INTRAVENOUS

## 2020-03-18 ENCOUNTER — Ambulatory Visit
Admission: RE | Admit: 2020-03-18 | Discharge: 2020-03-18 | Disposition: A | Discharge status: Home / Self Care | Payer: Self-pay | Source: Ambulatory Visit | Attending: General Surgery | Admitting: General Surgery

## 2020-03-18 ENCOUNTER — Encounter: Payer: Self-pay | Admitting: General Surgery

## 2020-03-18 ENCOUNTER — Ambulatory Visit: Payer: Self-pay | Admitting: Anesthesiology

## 2020-03-18 ENCOUNTER — Other Ambulatory Visit: Payer: Self-pay

## 2020-03-18 ENCOUNTER — Encounter
Admission: RE | Disposition: A | Discharge status: Home / Self Care | Payer: Self-pay | Source: Ambulatory Visit | Attending: General Surgery

## 2020-03-18 DIAGNOSIS — K219 Gastro-esophageal reflux disease without esophagitis: Secondary | ICD-10-CM | POA: Insufficient documentation

## 2020-03-18 DIAGNOSIS — K439 Ventral hernia without obstruction or gangrene: Secondary | ICD-10-CM | POA: Insufficient documentation

## 2020-03-18 DIAGNOSIS — Z6828 Body mass index (BMI) 28.0-28.9, adult: Secondary | ICD-10-CM | POA: Insufficient documentation

## 2020-03-18 DIAGNOSIS — F329 Major depressive disorder, single episode, unspecified: Secondary | ICD-10-CM | POA: Insufficient documentation

## 2020-03-18 DIAGNOSIS — E119 Type 2 diabetes mellitus without complications: Secondary | ICD-10-CM | POA: Insufficient documentation

## 2020-03-18 DIAGNOSIS — Z79899 Other long term (current) drug therapy: Secondary | ICD-10-CM | POA: Insufficient documentation

## 2020-03-18 DIAGNOSIS — J449 Chronic obstructive pulmonary disease, unspecified: Secondary | ICD-10-CM | POA: Insufficient documentation

## 2020-03-18 DIAGNOSIS — E669 Obesity, unspecified: Secondary | ICD-10-CM | POA: Insufficient documentation

## 2020-03-18 DIAGNOSIS — M1990 Unspecified osteoarthritis, unspecified site: Secondary | ICD-10-CM | POA: Insufficient documentation

## 2020-03-18 DIAGNOSIS — F17210 Nicotine dependence, cigarettes, uncomplicated: Secondary | ICD-10-CM | POA: Insufficient documentation

## 2020-03-18 DIAGNOSIS — E042 Nontoxic multinodular goiter: Secondary | ICD-10-CM | POA: Insufficient documentation

## 2020-03-18 HISTORY — PX: EPIGASTRIC HERNIA REPAIR: SHX404

## 2020-03-18 SURGERY — REPAIR, HERNIA, EPIGASTRIC, ADULT
Anesthesia: General

## 2020-03-18 MED ORDER — OXYCODONE HCL 5 MG/5ML PO SOLN: 5.0000 mg | Freq: Once | ORAL | Status: DC | PRN

## 2020-03-18 MED ORDER — LIDOCAINE-EPINEPHRINE (PF) 1 %-1:200000 IJ SOLN
INTRAMUSCULAR | Status: AC
  Filled 2020-03-18: qty 30

## 2020-03-18 MED ORDER — FAMOTIDINE 20 MG PO TABS
ORAL_TABLET | ORAL | Status: AC
  Administered 2020-03-18: 20 mg via ORAL
  Filled 2020-03-18: qty 1

## 2020-03-18 MED ORDER — ONDANSETRON HCL 4 MG/2ML IJ SOLN
INTRAMUSCULAR | Status: AC
  Administered 2020-03-18: 4 mg via INTRAVENOUS
  Filled 2020-03-18: qty 2

## 2020-03-18 MED ORDER — ROCURONIUM BROMIDE 100 MG/10ML IV SOLN
INTRAVENOUS | Status: DC | PRN
  Administered 2020-03-18: 50 mg via INTRAVENOUS

## 2020-03-18 MED ORDER — BUPIVACAINE HCL (PF) 0.25 % IJ SOLN
INTRAMUSCULAR | Status: AC
  Filled 2020-03-18: qty 30

## 2020-03-18 MED ORDER — KETOROLAC TROMETHAMINE 30 MG/ML IJ SOLN
INTRAMUSCULAR | Status: DC | PRN
  Administered 2020-03-18: 30 mg via INTRAVENOUS

## 2020-03-18 MED ORDER — ACETAMINOPHEN 500 MG PO TABS: 1000.0000 mg | ORAL_TABLET | ORAL | Status: DC

## 2020-03-18 MED ORDER — FENTANYL CITRATE (PF) 100 MCG/2ML IJ SOLN
INTRAMUSCULAR | Status: DC | PRN
  Administered 2020-03-18: 50 ug via INTRAVENOUS

## 2020-03-18 MED ORDER — LIDOCAINE-EPINEPHRINE 1 %-1:100000 IJ SOLN
INTRAMUSCULAR | Status: AC
  Filled 2020-03-18: qty 1

## 2020-03-18 MED ORDER — LACTATED RINGERS IV SOLN: INTRAVENOUS | Status: DC | PRN

## 2020-03-18 MED ORDER — MIDAZOLAM HCL 2 MG/2ML IJ SOLN
INTRAMUSCULAR | Status: DC | PRN
  Administered 2020-03-18: 2 mg via INTRAVENOUS

## 2020-03-18 MED ORDER — DEXAMETHASONE SODIUM PHOSPHATE 10 MG/ML IJ SOLN
INTRAMUSCULAR | Status: DC | PRN
  Administered 2020-03-18: 10 mg via INTRAVENOUS

## 2020-03-18 MED ORDER — CHLORHEXIDINE GLUCONATE CLOTH 2 % EX PADS: 6.0000 | MEDICATED_PAD | Freq: Once | CUTANEOUS | Status: DC

## 2020-03-18 MED ORDER — MIDAZOLAM HCL 2 MG/2ML IJ SOLN
INTRAMUSCULAR | Status: AC
  Filled 2020-03-18: qty 2

## 2020-03-18 MED ORDER — BUPIVACAINE LIPOSOME 1.3 % IJ SUSP
INTRAMUSCULAR | Status: AC
  Filled 2020-03-18: qty 20

## 2020-03-18 MED ORDER — LIDOCAINE HCL (CARDIAC) PF 100 MG/5ML IV SOSY
PREFILLED_SYRINGE | INTRAVENOUS | Status: DC | PRN
  Administered 2020-03-18: 80 mg via INTRAVENOUS

## 2020-03-18 MED ORDER — FENTANYL CITRATE (PF) 100 MCG/2ML IJ SOLN
INTRAMUSCULAR | Status: AC
  Filled 2020-03-18: qty 2

## 2020-03-18 MED ORDER — FENTANYL CITRATE (PF) 100 MCG/2ML IJ SOLN
25.0000 ug | INTRAMUSCULAR | Status: DC | PRN
  Administered 2020-03-18 (×3): 25 ug via INTRAVENOUS

## 2020-03-18 MED ORDER — IBUPROFEN 800 MG PO TABS: 800.0000 mg | ORAL_TABLET | Freq: Three times a day (TID) | ORAL | 0 refills | Status: AC | PRN

## 2020-03-18 MED ORDER — DEXMEDETOMIDINE HCL 200 MCG/2ML IV SOLN
INTRAVENOUS | Status: DC | PRN
  Administered 2020-03-18: 12 ug via INTRAVENOUS

## 2020-03-18 MED ORDER — CELECOXIB 200 MG PO CAPS: 200.0000 mg | ORAL_CAPSULE | ORAL | Status: DC

## 2020-03-18 MED ORDER — OXYCODONE HCL 5 MG PO TABS: 5.0000 mg | ORAL_TABLET | Freq: Once | ORAL | Status: DC | PRN

## 2020-03-18 MED ORDER — PHENYLEPHRINE HCL (PRESSORS) 10 MG/ML IV SOLN
INTRAVENOUS | Status: DC | PRN
  Administered 2020-03-18: 100 ug via INTRAVENOUS

## 2020-03-18 MED ORDER — PROPOFOL 500 MG/50ML IV EMUL
INTRAVENOUS | Status: AC
  Filled 2020-03-18: qty 50

## 2020-03-18 MED ORDER — BUPIVACAINE LIPOSOME 1.3 % IJ SUSP
INTRAMUSCULAR | Status: DC | PRN
  Administered 2020-03-18: 20 mL

## 2020-03-18 MED ORDER — HYDROCODONE-ACETAMINOPHEN 5-325 MG PO TABS: 1.0000 | ORAL_TABLET | Freq: Four times a day (QID) | ORAL | 0 refills | Status: AC | PRN

## 2020-03-18 MED ORDER — SODIUM CHLORIDE 0.9 % IV SOLN: INTRAVENOUS | Status: DC

## 2020-03-18 MED ORDER — BUPIVACAINE LIPOSOME 1.3 % IJ SUSP: 20.0000 mL | Freq: Once | INTRAMUSCULAR | Status: DC

## 2020-03-18 MED ORDER — ONDANSETRON HCL 4 MG/2ML IJ SOLN
INTRAMUSCULAR | Status: DC | PRN
  Administered 2020-03-18: 4 mg via INTRAVENOUS

## 2020-03-18 MED ORDER — CEFAZOLIN SODIUM-DEXTROSE 2-4 GM/100ML-% IV SOLN
INTRAVENOUS | Status: AC
  Filled 2020-03-18: qty 100

## 2020-03-18 MED ORDER — PROPOFOL 10 MG/ML IV BOLUS
INTRAVENOUS | Status: DC | PRN
  Administered 2020-03-18: 150 mg via INTRAVENOUS

## 2020-03-18 MED ORDER — SUGAMMADEX SODIUM 200 MG/2ML IV SOLN
INTRAVENOUS | Status: DC | PRN
  Administered 2020-03-18: 400 mg via INTRAVENOUS

## 2020-03-18 MED ORDER — ONDANSETRON HCL 4 MG/2ML IJ SOLN: 4.0000 mg | Freq: Once | INTRAMUSCULAR | Status: AC | PRN

## 2020-03-18 MED ORDER — FENTANYL CITRATE (PF) 100 MCG/2ML IJ SOLN
INTRAMUSCULAR | Status: AC
  Administered 2020-03-18: 09:00:00 25 ug via INTRAVENOUS
  Filled 2020-03-18: qty 2

## 2020-03-18 MED ORDER — LIDOCAINE-EPINEPHRINE 1 %-1:100000 IJ SOLN
INTRAMUSCULAR | Status: DC | PRN
  Administered 2020-03-18: 7 mL via INTRAMUSCULAR

## 2020-03-18 MED ORDER — FAMOTIDINE 20 MG PO TABS: 20.0000 mg | ORAL_TABLET | Freq: Once | ORAL | Status: AC

## 2020-03-18 MED ORDER — SODIUM CHLORIDE (PF) 0.9 % IJ SOLN
INTRAMUSCULAR | Status: AC
  Filled 2020-03-18: qty 50

## 2020-03-18 MED FILL — ?IBUPROFEN 800MG TABLETS: 800 | 10 days supply | Qty: 30 | Fill #0

## 2020-03-18 SURGICAL SUPPLY — 36 items
BLADE SURG 15 STRL LF DISP TIS (BLADE) ×1 IMPLANT
BLADE SURG 15 STRL SS (BLADE) ×2
CANISTER SUCT 1200ML W/VALVE (MISCELLANEOUS) ×2 IMPLANT
CHLORAPREP W/TINT 26 (MISCELLANEOUS) ×2 IMPLANT
COVER WAND RF STERILE (DRAPES) ×1 IMPLANT
DERMABOND ADVANCED (GAUZE/BANDAGES/DRESSINGS) ×1
DERMABOND ADVANCED .7 DNX12 (GAUZE/BANDAGES/DRESSINGS) ×1 IMPLANT
DRAIN PENROSE 5/8X18 LTX STRL (WOUND CARE) ×1 IMPLANT
DRAPE LAPAROTOMY 77X122 PED (DRAPES) ×2 IMPLANT
ELECT CAUTERY BLADE TIP 2.5 (TIP) ×2
ELECT REM PT RETURN 9FT ADLT (ELECTROSURGICAL) ×2
ELECTRODE CAUTERY BLDE TIP 2.5 (TIP) ×1 IMPLANT
ELECTRODE REM PT RTRN 9FT ADLT (ELECTROSURGICAL) ×1 IMPLANT
GLOVE BIO SURGEON STRL SZ 6.5 (GLOVE) ×4 IMPLANT
GLOVE INDICATOR 7.0 STRL GRN (GLOVE) ×5 IMPLANT
GOWN STRL REUS W/ TWL LRG LVL3 (GOWN DISPOSABLE) ×2 IMPLANT
GOWN STRL REUS W/TWL LRG LVL3 (GOWN DISPOSABLE) ×6
KIT TURNOVER KIT A (KITS) ×2 IMPLANT
LABEL OR SOLS (LABEL) ×2 IMPLANT
MESH VENTRALEX ST 1-7/10 CRC S (Mesh General) ×2 IMPLANT
NEEDLE HYPO 22GX1.5 SAFETY (NEEDLE) ×4 IMPLANT
NS IRRIG 500ML POUR BTL (IV SOLUTION) ×2 IMPLANT
PACK BASIN MINOR (MISCELLANEOUS) ×2 IMPLANT
RETRACTOR RING XSMALL (MISCELLANEOUS) IMPLANT
RTRCTR WOUND ALEXIS 13CM XS SH (MISCELLANEOUS) ×2
STRIP CLOSURE SKIN 1/2X4 (GAUZE/BANDAGES/DRESSINGS) ×2 IMPLANT
SUT ETHIBOND NAB MO 7 #0 18IN (SUTURE) ×1 IMPLANT
SUT MNCRL 4-0 (SUTURE) ×2
SUT MNCRL 4-0 27XMFL (SUTURE) ×1
SUT VIC AB 3-0 SH 27 (SUTURE) ×2
SUT VIC AB 3-0 SH 27X BRD (SUTURE) ×1 IMPLANT
SUT VICRYL 2-0 SH 8X27 (SUTURE) ×1 IMPLANT
SUTURE MNCRL 4-0 27XMF (SUTURE) ×1 IMPLANT
SYR 10ML LL (SYRINGE) ×2 IMPLANT
SYR 20ML LL LF (SYRINGE) ×2 IMPLANT
SYR BULB IRRIG 60ML STRL (SYRINGE) ×2 IMPLANT

## 2020-03-18 NOTE — Anesthesia Postprocedure Evaluation (Signed)
Anesthesia Post Note  Patient: [name]  Procedure(s) Performed: HERNIA REPAIR EPIGASTRIC ADULT, Open (N/A )  Patient location during evaluation: PACU Anesthesia Type: General Level of consciousness: awake and alert Pain management: pain level controlled Vital Signs Assessment: post-procedure vital signs reviewed and stable Respiratory status: spontaneous breathing, nonlabored ventilation and respiratory function stable Cardiovascular status: blood pressure returned to baseline and stable Postop Assessment: no apparent nausea or vomiting Anesthetic complications: no     Last Vitals:  Vitals:   03/18/20 0927 03/18/20 0934  BP: 128/73 132/79  Pulse: 74 77  Resp: 16 16  Temp: 36.7 C 36.5 C  SpO2: 98% 100%    Last Pain:  Vitals:   03/18/20 0934  TempSrc: Temporal  PainSc: 0-No pain                 Tera Mater

## 2020-03-18 NOTE — Anesthesia Procedure Notes (Signed)
Procedure Name: Intubation Date/Time: 03/18/2020 7:43 AM Performed by: Justus Memory, CRNA Pre-anesthesia Checklist: Patient identified, Patient being monitored, Timeout performed, Emergency Drugs available and Suction available Patient Re-evaluated:Patient Re-evaluated prior to induction Oxygen Delivery Method: Circle system utilized Preoxygenation: Pre-oxygenation with 100% oxygen Induction Type: IV induction Ventilation: Oral airway inserted - appropriate to patient size and Mask ventilation with difficulty Laryngoscope Size: 3 and McGraph Grade View: Grade I Tube type: Oral Tube size: 7.0 mm Number of attempts: 1 Airway Equipment and Method: Stylet and Video-laryngoscopy Placement Confirmation: ETT inserted through vocal cords under direct vision,  positive ETCO2 and breath sounds checked- equal and bilateral Secured at: 21 cm Tube secured with: Tape Dental Injury: Teeth and Oropharynx as per pre-operative assessment

## 2020-03-18 NOTE — Anesthesia Preprocedure Evaluation (Addendum)
Anesthesia Evaluation  Patient identified by MRN, date of birth, ID band Patient awake    Reviewed: Allergy & Precautions, H&P , NPO status , Patient's Chart, lab work & pertinent test results  Airway Mallampati: II  TM Distance: >3 FB Neck ROM: full    Dental  (+) Poor Dentition   Pulmonary shortness of breath and with exertion, COPD,  COPD inhaler, Current Smoker,    breath sounds clear to auscultation       Cardiovascular (-) angina(-) Past MI negative cardio ROS  (-) dysrhythmias  Rhythm:regular Rate:Normal     Neuro/Psych PSYCHIATRIC DISORDERS Anxiety Depression negative neurological ROS     GI/Hepatic Neg liver ROS, hiatal hernia, GERD  Controlled,  Endo/Other  diabetes  Renal/GU      Musculoskeletal   Abdominal   Peds  Hematology negative hematology ROS (+)   Anesthesia Other Findings Past Medical History: No date: Anxiety No date: Arthritis No date: COPD (chronic obstructive pulmonary disease) ([facility]) No date: Depression No date: Diabetes mellitus without complication ([facility])     Comment:  lost weight No date: GERD (gastroesophageal reflux disease) No date: History of hiatal hernia No date: Obesity No date: Pneumonia No date: UTI (urinary tract infection)  Past Surgical History: No date: ABDOMINAL HYSTERECTOMY     Comment:  partial No date: CHOLECYSTECTOMY 2018, 12/22/19: COLONOSCOPY     Comment:  Community clinic in [place]  No date: TUBAL LIGATION 2018, 12/22/19: UPPER GI ENDOSCOPY     Comment:  Took out 4 polyps. [facility] around 2018  BMI    Body Mass Index: 28.91 kg/m      Reproductive/Obstetrics negative OB ROS                            Anesthesia Physical Anesthesia Plan  ASA: II  Anesthesia Plan: General ETT   Post-op Pain Management:    Induction:   PONV Risk Score and Plan: Ondansetron, Dexamethasone, Treatment may vary due to age or  medical condition and Midazolam  Airway Management Planned:   Additional Equipment:   Intra-op Plan:   Post-operative Plan:   Informed Consent: I have reviewed the patients History and Physical, chart, labs and discussed the procedure including the risks, benefits and alternatives for the proposed anesthesia with the patient or authorized representative who has indicated his/her understanding and acceptance.     Dental Advisory Given  Plan Discussed with: Anesthesiologist, CRNA and Surgeon  Anesthesia Plan Comments:        Anesthesia Quick Evaluation

## 2020-03-18 NOTE — Transfer of Care (Signed)
Immediate Anesthesia Transfer of Care Note  Patient: [name]  Procedure(s) Performed: HERNIA REPAIR EPIGASTRIC ADULT, Open (N/A )  Patient Location: PACU  Anesthesia Type:General  Level of Consciousness: sedated  Airway & Oxygen Therapy: Patient Spontanous Breathing and Patient connected to face mask oxygen  Post-op Assessment: Report given to RN and Post -op Vital signs reviewed and stable  Post vital signs: Reviewed and stable  Last Vitals:  Vitals Value Taken Time  BP 139/72 03/18/20 0847  Temp    Pulse 85 03/18/20 0851  Resp 24 03/18/20 0852  SpO2 96 % 03/18/20 0851  Vitals shown include unvalidated device data.  Last Pain:  Vitals:   03/18/20 0617  TempSrc: Tympanic  PainSc: 0-No pain         Complications: No apparent anesthesia complications

## 2020-03-18 NOTE — Discharge Instructions (Signed)
AMBULATORY SURGERY  DISCHARGE INSTRUCTIONS   1) The drugs that you were given will stay in your system until tomorrow so for the next 24 hours you should not:  A) Drive an automobile B) Make any legal decisions C) Drink any alcoholic beverage   2) You may resume regular meals tomorrow.  Today it is better to start with liquids and gradually work up to solid foods.  You may eat anything you prefer, but it is better to start with liquids, then soup and crackers, and gradually work up to solid foods.   3) Please notify your doctor immediately if you have any unusual bleeding, trouble breathing, redness and pain at the surgery site, drainage, fever, or pain not relieved by medication. 4)   5) Your post-operative visit with Dr.                                     is: Date:                        Time:    Please call to schedule your post-operative visit.  6) Additional Instructions:     Open Hernia Repair, Adult, Care After This sheet gives you information about how to care for yourself after your procedure. Your health care provider may also give you more specific instructions. If you have problems or questions, contact your health care provider. What can I expect after the procedure? After the procedure, it is common to have:  Mild discomfort.  Slight bruising.  Minor swelling.  Pain in the abdomen. Follow these instructions at home: Incision care   Follow instructions from your health care provider about how to take care of your incision area. Make sure you: ? Wash your hands with soap and water before you change your bandage (dressing). If soap and water are not available, use hand sanitizer. ? Change your dressing as told by your health care provider. ? Leave stitches (sutures), skin glue, or adhesive strips in place. These skin closures may need to stay in place for 2 weeks or longer. If adhesive strip edges start to loosen and curl up, you may trim the loose edges.  Do not remove adhesive strips completely unless your health care provider tells you to do that.  Check your incision area every day for signs of infection. Check for: ? More redness, swelling, or pain. ? More fluid or blood. ? Warmth. ? Pus or a bad smell. Activity  Do not drive or use heavy machinery while taking prescription pain medicine. Do not drive until your health care provider approves.  Until your health care provider approves: ? Do not lift anything that is heavier than 10 lb (4.5 kg). ? Do not play contact sports.  Return to your normal activities as told by your health care provider. Ask your health care provider what activities are safe. General instructions  To prevent or treat constipation while you are taking prescription pain medicine, your health care provider may recommend that you: ? Drink enough fluid to keep your urine clear or pale yellow. ? Take over-the-counter or prescription medicines. ? Eat foods that are high in fiber, such as fresh fruits and vegetables, whole grains, and beans. ? Limit foods that are high in fat and processed sugars, such as fried and sweet foods.  Take over-the-counter and prescription medicines only as told by your health care  provider.  Do not take tub baths or go swimming until your health care provider approves.  Keep all follow-up visits as told by your health care provider. This is important. Contact a health care provider if:  You develop a rash.  You have more redness, swelling, or pain around your incision.  You have more fluid or blood coming from your incision.  Your incision feels warm to the touch.  You have pus or a bad smell coming from your incision.  You have a fever or chills.  You have blood in your stool (feces).  You have not had a bowel movement in 2-3 days.  Your pain is not controlled with medicine. Get help right away if:  You have chest pain or shortness of breath.  You feel light-headed or  feel faint.  You have severe pain.  You vomit and your pain is worse. This information is not intended to replace advice given to you by your health care provider. Make sure you discuss any questions you have with your health care provider. Document Revised: 10/11/2017 Document Reviewed: 04/11/2016 Elsevier Patient Education  2020 [employer].

## 2020-03-18 NOTE — H&P (Signed)
HPI  [name] is a 59 y.o. female.  She has been referred by her primary care provider, Abigail Rankins, NP, for evaluation of a ventral hernia. [name] states that she has noticed a bulge in her upper abdomen for about 2 to 3 years. It is worse with lifting and increased activity. The discomfort is localized to the area of the bulge. She has had some occasional nausea, but attributes this to some of her recent gastric issues. She denies any obstipation constipation or other symptoms of obstruction. The discomfort she is experiencing from the hernia has been interfering with her activities of daily living. She is interested in surgical repair.       Past Medical History:  Diagnosis Date  . Anxiety   . Arthritis   . COPD (chronic obstructive pulmonary disease) ([place])   . Depression   . Diabetes mellitus without complication ([place])   . Obesity   . Pneumonia   . UTI (urinary tract infection)         Past Surgical History:  Procedure Laterality Date  . ABDOMINAL HYSTERECTOMY     partial  . CHOLECYSTECTOMY    . COLONOSCOPY  2018, 12/22/19   Community clinic in [place]   . UPPER GI ENDOSCOPY  2018, 12/22/19   Took out 4 polyps. [facility] around 2018        Family History  Problem Relation Age of Onset  . Diabetes Mother   . Lung cancer Father   . Liver disease Father   . Clotting disorder Sister   . Colon polyps Brother    cut some of his large instestine out as well   . Clotting disorder Brother   . Lung cancer Brother   . Colon cancer Maternal Aunt   . Kidney disease Maternal Aunt    Social History  Social History        Tobacco Use  . Smoking status: Current Some Day Smoker    Packs/day: 0.50    Types: Cigarettes  . Smokeless tobacco: Never Used  Substance Use Topics  . Alcohol use: Yes    Comment: occasional   . Drug use: Yes    Types: Marijuana        Allergies  Allergen Reactions  . Gabapentin Other (See Comments)    confusion  .  Penicillins   . Sulfa Antibiotics          Current Outpatient Medications  Medication Sig Dispense Refill  . albuterol (VENTOLIN HFA) 108 (90 Base) MCG/ACT inhaler Inhale 1-2 puffs into the lungs every 6 (six) hours as needed for wheezing (COUGH). 18 g 1  . diphenhydrAMINE (BENADRYL ALLERGY) 25 mg capsule Take 25 mg by mouth 2 (two) times daily as needed.    . fluticasone (FLONASE) 50 MCG/ACT nasal spray Place 1 spray into both nostrils 2 (two) times daily as needed for allergies or rhinitis.    Marland Kitchen omeprazole (PRILOSEC) 20 MG capsule Take 20 mg by mouth daily.    . Psyllium (METAMUCIL PO) Take by mouth.    . dicyclomine (BENTYL) 20 MG tablet Take 1 tablet (20 mg total) by mouth 4 (four) times daily - before meals and at bedtime. 120 tablet 3   No current facility-administered medications for this visit.   Review of Systems  Review of Systems  Neurological: Positive for numbness.  She reports numbness in her arms and legs. This has not been evaluated by her primary care provider, according to  the patient.  All other systems reviewed and are negative.  Or as discussed in the history of present illness   Today's Vitals   03/18/20 0617  BP: 123/90  Pulse: 67  Resp: 18  Temp: (!) 97.3 F (36.3 C)  TempSrc: Tympanic  SpO2: 100%  Weight: 71.7 kg  Height: 5\' 2"  (1.575 m)  PainSc: 0-No pain   Body mass index is 28.91 kg/m.  Physical Exam  Constitutional:  General: She is not in acute distress. Appearance: Normal appearance. She is obese.  HENT:  Head: Normocephalic and atraumatic.  Nose:  Comments: Covered with a mask secondary to COVID-19 precautions Mouth/Throat:  Comments: Covered with a mask secondary to COVID-19 precautions Eyes:  General: No scleral icterus.  Right eye: No discharge.  Left eye: No discharge.  Neck:  Comments: The thyroid is enlarged and has an irregular contour. The gland moves freely with deglutition. Cardiovascular:  Rate and Rhythm: Normal rate  and regular rhythm.  Comments: Pedal pulses are diminished bilaterally. Pulmonary:  Effort: Pulmonary effort is normal. No respiratory distress.  Breath sounds: Normal breath sounds.  Abdominal:  General: Abdomen is flat.  Palpations: Abdomen is soft.  Comments: There is a definite bulge associated with a subxiphoid trocar site from her prior laparoscopic cholecystectomy. I am unable to palpate the fascial edges on exam. It is most noticeable with Valsalva.  Genitourinary:  Comments: Deferred Musculoskeletal:  General: No swelling or tenderness.  Cervical back: Normal range of motion.  Lymphadenopathy:  Cervical: No cervical adenopathy.  Skin:  General: Skin is warm and dry.  Neurological:  General: No focal deficit present.  Mental Status: She is alert and oriented to person, place, and time.  Psychiatric:  Mood and Affect: Mood normal.  Behavior: Behavior normal.   Data Reviewed  On review of the electronic medical record, she has actually had more recent endoscopic evaluations, done at [facility]. She had both a colonoscopy and upper endoscopy on December 22, 2019. Hemorrhoids and polyps were found on her colonoscopy. Biopsies were also taken to look for microscopic colitis. The polyps were negative for malignancy and there was no microscopic colitis seen. On her upper endoscopy, there appeared to be some reactive gastropathy and H. pylori testing was negative.  In January 2020, she had a CT scan of the abdomen and pelvis performed for abdominal pain. This was done at [facility]. She had diverticulosis without diverticulitis. There were also concerns for avascular necrosis of the right femoral head. She has actually had multiple CT scans performed in the [facility] system, all of which appear to have been performed for abdominal pain. On a scan performed June 06, 2019, a small right paramedian upper abdominal ventral hernia and a small midline  infraumbilical pelvic ventral hernia were described. Both contain fat without any inflammation in the herniated fat. These images are not available for me to review.   Assessment  This is a 59 year old woman who has 2 ventral hernias. The infraumbilical hernia is not palpable on exam and is asymptomatic. The other hernia, associated with a prior laparoscopic trocar site, is causing her discomfort and she is interested in surgical repair.  On examination today, an abnormal thyroid gland was palpated. I performed a bedside ultrasound of the thyroid and identified an enlarged, multinodular goiter. The majority of the nodules were cystic. There is an ill-defined nodule just to the left of the isthmus, as well as a larger nodule with in the left lobe  parenchyma. It is taller than wide and profoundly hypoechoic. It has somewhat irregular margins. It does not have increased intranodular vascular flow. I recommended that she undergo thyroid biopsy.   Plan  I performed a fine-needle aspiration biopsy in clinic of the suspicious nodule. Slides were sent for evaluation and ThyroSeq was collected. I will contact her with the results of her biopsy. In the meantime, we are requesting the CT images from [facility] to better evaluate and plan for surgical intervention on her hernias. Once all the data are collected, I will contact the patient we will discuss the plan going forward.    The CT scan from [facility] was obtained and reviewed.  There is a hernia that appears to be associated with her prior subxyphoid trochar.  The infraumbilical one does not bother the patient and therefore, we will proceed with repair of just the subxyphoid site today.

## 2020-03-18 NOTE — Op Note (Signed)
Operative Note  Pre-operative Diagnosis: Ventral incisional epigastric hernia  Post-operative Diagnosis: same  Operation: Open ventral incisional epigastric hernia repair with mesh  Surgeon: Fredirick Maudlin, MD  Anesthesia: GETA  Assistant: Armida Sans, RNFA  Findings: Incarcerated fat within a small hernia associated with the patient's prior laparoscopic cholecystectomy trocar site.  Estimated Blood Loss: Less than 2 cc         Drains: None         Specimens: None          Complications: None immediately apparent         Condition: stable  Procedure Details  The patient was identified in the preoperative holding area. The benefits, complications, treatment options, and expected outcomes were discussed with the patient. The risks of bleeding, infection, recurrence of symptoms, failure to resolve symptoms, bowel injury, any of which could require further surgery were reviewed with the patient. The patient agreed to accept these risks. The patient was then taken to the operating room, identified as [name] and the procedure verified.  A time out was performed and the above information confirmed.  Prior to the induction of general anesthesia, antibiotic prophylaxis was administered. VTE prophylaxis was in place. General endotracheal anesthesia was then administered and tolerated well. After induction, the abdomen was prepped with Chloraprep and draped in standard sterile fashion. The patient was positioned in the supine position.  The skin and subcutaneous tissues overlying the prior laparoscopic trocar site scar were infused with a one-to-one mixture of 0.25% bupivacaine and 1% lidocaine with epinephrine.  An incision was created over the hernia sac and electrocautery was used to dissect through subcutaneous tissue.  Incarcerated fat was appreciated with in an approximately 1 cm defect.  The hernia was dissected free from adjacent tissue and fascia. The hernia sac was entered  and the sac was excised.  Was then able to reduce the fat back into the abdominal cavity.  Care was taken to avoid any injury to the bowel.  A 4.3 cm Ventralex ST mesh was introduced and attached to the fascia using interrupted 0 Ethibond sutures in standard fashion.  The fascia was then closed over the mesh with interrupted 0 Ethibond suture.  Liposomal bupivacaine was infiltrated along the fascial planes.  The cutaneous tissue was closed with 3-0 Vicryl and skin was closed with a subcuticular 4-0 Monocryl. Dermabond was applied to the skin, followed by Steri-Strips. Patient tolerated procedure well and there were no immediate complications identified. Needle, instrument, and sponge counts were reported to be correct by the nursing staff.  Fredirick Maudlin, MD FACS

## 2020-04-05 ENCOUNTER — Telehealth: Payer: Self-pay | Admitting: General Surgery

## 2021-08-21 ENCOUNTER — Encounter: Payer: Self-pay | Admitting: General Surgery

## 2022-03-19 ENCOUNTER — Encounter: Payer: Self-pay | Admitting: Gastroenterology
